# Patient Record
Sex: Male | Born: 1960 | Race: Black or African American | Hispanic: No | Marital: Married | Smoking: Never smoker
Health system: Southern US, Community
[De-identification: ages and names within clinical notes are randomized; demographics above are authoritative.]

## PROBLEM LIST (undated history)

## (undated) DIAGNOSIS — I10 Essential (primary) hypertension: Secondary | ICD-10-CM

## (undated) DIAGNOSIS — I2510 Atherosclerotic heart disease of native coronary artery without angina pectoris: Secondary | ICD-10-CM

## (undated) DIAGNOSIS — R569 Unspecified convulsions: Secondary | ICD-10-CM

## (undated) DIAGNOSIS — N289 Disorder of kidney and ureter, unspecified: Secondary | ICD-10-CM

## (undated) HISTORY — PX: OTHER SURGICAL HISTORY: SHX169

## (undated) HISTORY — PX: ABDOMINAL SURGERY: SHX537

---

## 2013-06-14 ENCOUNTER — Emergency Department (HOSPITAL_COMMUNITY)
Admission: EM | Admit: 2013-06-14 | Discharge: 2013-06-15 | Disposition: A | Discharge status: Home / Self Care | Payer: Self-pay | Attending: Emergency Medicine | Admitting: Emergency Medicine

## 2013-06-14 ENCOUNTER — Encounter (HOSPITAL_COMMUNITY): Payer: Self-pay | Admitting: Adult Health

## 2013-06-14 DIAGNOSIS — T6391XA Toxic effect of contact with unspecified venomous animal, accidental (unintentional), initial encounter: Secondary | ICD-10-CM | POA: Insufficient documentation

## 2013-06-14 DIAGNOSIS — Y9389 Activity, other specified: Secondary | ICD-10-CM | POA: Insufficient documentation

## 2013-06-14 DIAGNOSIS — T63461A Toxic effect of venom of wasps, accidental (unintentional), initial encounter: Secondary | ICD-10-CM | POA: Insufficient documentation

## 2013-06-14 DIAGNOSIS — Y929 Unspecified place or not applicable: Secondary | ICD-10-CM | POA: Insufficient documentation

## 2013-06-14 DIAGNOSIS — L299 Pruritus, unspecified: Secondary | ICD-10-CM | POA: Insufficient documentation

## 2013-06-14 MED ORDER — FAMOTIDINE 20 MG PO TABS
20.0000 mg | ORAL_TABLET | Freq: Once | ORAL | Status: AC
  Administered 2013-06-14: 20 mg via ORAL
  Filled 2013-06-14: qty 1

## 2013-06-14 MED ORDER — PREDNISONE 20 MG PO TABS: 20.0000 mg | ORAL_TABLET | Freq: Two times a day (BID) | ORAL | Status: DC

## 2013-06-14 MED ORDER — DEXAMETHASONE SODIUM PHOSPHATE 10 MG/ML IJ SOLN
10.0000 mg | Freq: Once | INTRAMUSCULAR | Status: AC
  Administered 2013-06-14: 10 mg via INTRAMUSCULAR
  Filled 2013-06-14: qty 1

## 2013-06-14 MED ORDER — EPINEPHRINE 0.3 MG/0.3ML IJ SOAJ: 0.3000 mg | Freq: Once | INTRAMUSCULAR | Status: DC

## 2013-06-14 MED ORDER — DIPHENHYDRAMINE HCL 25 MG PO CAPS
25.0000 mg | ORAL_CAPSULE | Freq: Once | ORAL | Status: AC
  Administered 2013-06-14: 25 mg via ORAL
  Filled 2013-06-14: qty 1

## 2013-06-14 NOTE — ED Notes (Signed)
Presents with urticaria and itching to face, bilateral extremities, legs and back that began after being stung by multiple bees at 20:40 this evening while cleaning around the yard. Denies SOB, airway clear, no stridor, no wheezes, able to speak in full sentences.

## 2013-06-15 NOTE — ED Provider Notes (Signed)
  CSN: [id number]     Arrival date & time 06/14/13  2134 History     First MD Initiated Contact with Patient 06/14/13 2259     Chief Complaint  Patient presents with  . Allergic Reaction   (Consider location/radiation/quality/duration/timing/severity/associated sxs/prior Treatment) HPI History provided by pt.   Pt stung by 7 yellow jackets at 8:45pm today.  Has six stings to LEs, one to posterior neck and one behind L ear.  Developed diffuse, severely pruritic hives shortly after. Associated with edema of lower lip.  Received benadryl, pepcid and prednisone in triage; rash improved and lip edema resolved.    History reviewed. No pertinent past medical history. History reviewed. No pertinent past surgical history. History reviewed. No pertinent family history. History  Substance Use Topics  . Smoking status: Never Smoker   . Smokeless tobacco: Not on file  . Alcohol Use: No    Review of Systems  All other systems reviewed and are negative.    Allergies  Review of patient's allergies indicates no known allergies.  Home Medications   Current Outpatient Rx  Name  Route  Sig  Dispense  Refill  . EPINEPHrine (EPI-PEN) 0.3 mg/0.3 mL SOAJ   Intramuscular   Inject 0.3 mLs (0.3 mg total) into the muscle once.   1 Device   0   . predniSONE (DELTASONE) 20 MG tablet   Oral   Take 1 tablet (20 mg total) by mouth 2 (two) times daily.   10 tablet   0    BP 187/99  Pulse 95  Temp(Src) 98.4 F (36.9 C) (Oral)  Resp 20  Ht 6' (1.829 m)  Wt 187 lb (84.823 kg)  BMI 25.36 kg/m2  SpO2 98% Physical Exam  Nursing note and vitals reviewed. Constitutional: He is oriented to person, place, and time. He appears well-developed and well-nourished. No distress.  Pt is sitting on edge of bed calmly.  He is not scratching.  HENT:  Head: Normocephalic and atraumatic.  Mouth/Throat: Oropharynx is clear and moist and mucous membranes are normal. No posterior oropharyngeal edema.  No lip or  tongue edema.  Eyes:  Normal appearance  Neck: Normal range of motion.  Cardiovascular: Normal rate and regular rhythm.   Pulmonary/Chest: Effort normal and breath sounds normal. No stridor. No respiratory distress.  Musculoskeletal: Normal range of motion.  Neurological: He is alert and oriented to person, place, and time.  Skin: Skin is warm and dry. No rash noted.  Diffuse hives, most concentrated in RUE.    Psychiatric: He has a normal mood and affect. His behavior is normal.    ED Course   Procedures (including critical care time)  Labs Reviewed - No data to display No results found. 1. Allergic reaction to insect sting, initial encounter     MDM  52yo M presents w/ allergic reaction in insect sting.  Treated w/ antihistamines and prednisone in triage and sx improved.  Had edema of lower lip that has resolved.  No signs of impending airway compromise on exam.  Pt feeling well enough to go home.  Recommended bid benadryl/pepcid AC x 3d and prescribed prednisone and epi pen.  Return precautions discussed.   Otilio Miu, PA-C 06/15/13 2155

## 2013-06-16 NOTE — ED Provider Notes (Signed)
Medical screening examination/treatment/procedure(s) were performed by non-physician practitioner and as supervising physician I was immediately available for consultation/collaboration.  John-Adam Loukisha Gunnerson, M.D.  John-Adam Kitai Purdom, MD 06/16/13 0702 

## 2018-08-24 ENCOUNTER — Observation Stay (HOSPITAL_COMMUNITY)
Admission: EM | Admit: 2018-08-24 | Discharge: 2018-08-25 | Disposition: A | Discharge status: Home / Self Care | Payer: No Typology Code available for payment source | Attending: Internal Medicine | Admitting: Internal Medicine

## 2018-08-24 ENCOUNTER — Other Ambulatory Visit: Payer: Self-pay

## 2018-08-24 ENCOUNTER — Emergency Department (HOSPITAL_COMMUNITY): Payer: No Typology Code available for payment source

## 2018-08-24 ENCOUNTER — Encounter (HOSPITAL_COMMUNITY): Payer: Self-pay | Admitting: Emergency Medicine

## 2018-08-24 DIAGNOSIS — Y92414 Local residential or business street as the place of occurrence of the external cause: Secondary | ICD-10-CM | POA: Diagnosis not present

## 2018-08-24 DIAGNOSIS — I10 Essential (primary) hypertension: Secondary | ICD-10-CM | POA: Diagnosis not present

## 2018-08-24 DIAGNOSIS — Y999 Unspecified external cause status: Secondary | ICD-10-CM | POA: Insufficient documentation

## 2018-08-24 DIAGNOSIS — S61511A Laceration without foreign body of right wrist, initial encounter: Secondary | ICD-10-CM | POA: Diagnosis present

## 2018-08-24 DIAGNOSIS — R569 Unspecified convulsions: Secondary | ICD-10-CM | POA: Diagnosis not present

## 2018-08-24 DIAGNOSIS — Z23 Encounter for immunization: Secondary | ICD-10-CM | POA: Insufficient documentation

## 2018-08-24 DIAGNOSIS — Z7982 Long term (current) use of aspirin: Secondary | ICD-10-CM | POA: Insufficient documentation

## 2018-08-24 DIAGNOSIS — N179 Acute kidney failure, unspecified: Secondary | ICD-10-CM | POA: Diagnosis not present

## 2018-08-24 DIAGNOSIS — Y9389 Activity, other specified: Secondary | ICD-10-CM | POA: Insufficient documentation

## 2018-08-24 DIAGNOSIS — Z79899 Other long term (current) drug therapy: Secondary | ICD-10-CM | POA: Diagnosis not present

## 2018-08-24 HISTORY — DX: Essential (primary) hypertension: I10

## 2018-08-24 LAB — SAMPLE TO BLOOD BANK

## 2018-08-24 LAB — CBC
HCT: 46.2 % (ref 39.0–52.0)
Hemoglobin: 14.7 g/dL (ref 13.0–17.0)
MCH: 30.6 pg (ref 26.0–34.0)
MCHC: 31.8 g/dL (ref 30.0–36.0)
MCV: 96.3 fL (ref 78.0–100.0)
PLATELETS: 183 10*3/uL (ref 150–400)
RBC: 4.8 MIL/uL (ref 4.22–5.81)
RDW: 13.1 % (ref 11.5–15.5)
WBC: 9.1 10*3/uL (ref 4.0–10.5)

## 2018-08-24 LAB — COMPREHENSIVE METABOLIC PANEL
ALK PHOS: 67 U/L (ref 38–126)
ALT: 22 U/L (ref 0–44)
AST: 25 U/L (ref 15–41)
Albumin: 4.4 g/dL (ref 3.5–5.0)
Anion gap: 11 (ref 5–15)
BILIRUBIN TOTAL: 1 mg/dL (ref 0.3–1.2)
BUN: 22 mg/dL — ABNORMAL HIGH (ref 6–20)
CALCIUM: 9 mg/dL (ref 8.9–10.3)
CO2: 24 mmol/L (ref 22–32)
CREATININE: 1.94 mg/dL — AB (ref 0.61–1.24)
Chloride: 102 mmol/L (ref 98–111)
GFR calc Af Amer: 42 mL/min — ABNORMAL LOW (ref >60)
GFR, EST NON AFRICAN AMERICAN: 36 mL/min — AB (ref >60)
Glucose, Bld: 118 mg/dL — ABNORMAL HIGH (ref 70–99)
Potassium: 3.8 mmol/L (ref 3.5–5.1)
Sodium: 137 mmol/L (ref 135–145)
Total Protein: 7.3 g/dL (ref 6.5–8.1)

## 2018-08-24 LAB — URINALYSIS, ROUTINE W REFLEX MICROSCOPIC
BILIRUBIN URINE: NEGATIVE
Glucose, UA: NEGATIVE mg/dL
HGB URINE DIPSTICK: NEGATIVE
KETONES UR: NEGATIVE mg/dL
Leukocytes, UA: NEGATIVE
Nitrite: NEGATIVE
PROTEIN: NEGATIVE mg/dL
SPECIFIC GRAVITY, URINE: 1.005 (ref 1.005–1.030)
pH: 7 (ref 5.0–8.0)

## 2018-08-24 LAB — I-STAT TROPONIN, ED: Troponin i, poc: 0 ng/mL (ref 0.00–0.08)

## 2018-08-24 LAB — I-STAT CHEM 8, ED
BUN: 25 mg/dL — AB (ref 6–20)
CHLORIDE: 103 mmol/L (ref 98–111)
CREATININE: 1.9 mg/dL — AB (ref 0.61–1.24)
Calcium, Ion: 1.09 mmol/L — ABNORMAL LOW (ref 1.15–1.40)
Glucose, Bld: 116 mg/dL — ABNORMAL HIGH (ref 70–99)
HCT: 46 % (ref 39.0–52.0)
Hemoglobin: 15.6 g/dL (ref 13.0–17.0)
Potassium: 3.8 mmol/L (ref 3.5–5.1)
Sodium: 138 mmol/L (ref 135–145)
TCO2: 25 mmol/L (ref 22–32)

## 2018-08-24 LAB — PROTIME-INR
INR: 0.96
PROTHROMBIN TIME: 12.7 s (ref 11.4–15.2)

## 2018-08-24 LAB — I-STAT CG4 LACTIC ACID, ED: LACTIC ACID, VENOUS: 1.65 mmol/L (ref 0.5–1.9)

## 2018-08-24 LAB — CBG MONITORING, ED: Glucose-Capillary: 181 mg/dL — ABNORMAL HIGH (ref 70–99)

## 2018-08-24 LAB — ETHANOL

## 2018-08-24 MED ORDER — LORAZEPAM 2 MG/ML IJ SOLN
INTRAMUSCULAR | Status: AC
  Filled 2018-08-24: qty 1

## 2018-08-24 MED ORDER — LORAZEPAM 2 MG/ML IJ SOLN: 1.0000 mg | INTRAMUSCULAR | Status: DC | PRN

## 2018-08-24 MED ORDER — TETANUS-DIPHTH-ACELL PERTUSSIS 5-2.5-18.5 LF-MCG/0.5 IM SUSP
0.5000 mL | Freq: Once | INTRAMUSCULAR | Status: AC
  Administered 2018-08-24: 0.5 mL via INTRAMUSCULAR

## 2018-08-24 MED ORDER — LEVETIRACETAM IN NACL 1000 MG/100ML IV SOLN
1000.0000 mg | Freq: Once | INTRAVENOUS | Status: AC
  Administered 2018-08-24: 1000 mg via INTRAVENOUS
  Filled 2018-08-24: qty 100
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
## 2018-08-24 NOTE — ED Notes (Signed)
XR at bedside
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
## 2018-08-24 NOTE — ED Notes (Signed)
Called to bedside by family due to patient having generalized tonic clonic seizure.  Patient foaming at the mouth, suctioned for small amount of pink tinged saliva.  Patient was incontinent of urine.  Patient cleaned, linens changed and condom cath applied.  Seizure lasted 1-2 mins.

## 2018-08-24 NOTE — ED Notes (Signed)
Patient transported to CT 

## 2018-08-24 NOTE — ED Notes (Signed)
Patient at bedside to give urine sample, patient is CAOx4 at this time.
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
## 2018-08-24 NOTE — ED Notes (Signed)
Pt had generalized jerking that lasted approx 2 minutes  With loss of colnsciouosness.  Sister and wife AT THE BEDSDIE

## 2018-08-25 ENCOUNTER — Observation Stay (HOSPITAL_COMMUNITY): Payer: No Typology Code available for payment source

## 2018-08-25 ENCOUNTER — Encounter (HOSPITAL_COMMUNITY): Payer: Self-pay | Admitting: Internal Medicine

## 2018-08-25 DIAGNOSIS — R569 Unspecified convulsions: Secondary | ICD-10-CM

## 2018-08-25 DIAGNOSIS — N179 Acute kidney failure, unspecified: Secondary | ICD-10-CM | POA: Diagnosis present

## 2018-08-25 LAB — RAPID URINE DRUG SCREEN, HOSP PERFORMED
Amphetamines: NOT DETECTED
Barbiturates: NOT DETECTED
Benzodiazepines: NOT DETECTED
COCAINE: NOT DETECTED
Opiates: NOT DETECTED
TETRAHYDROCANNABINOL: POSITIVE — AB

## 2018-08-25 LAB — CREATININE, URINE, RANDOM: Creatinine, Urine: 24.94 mg/dL

## 2018-08-25 LAB — BASIC METABOLIC PANEL
ANION GAP: 12 (ref 5–15)
BUN: 18 mg/dL (ref 6–20)
CHLORIDE: 106 mmol/L (ref 98–111)
CO2: 21 mmol/L — ABNORMAL LOW (ref 22–32)
Calcium: 9.2 mg/dL (ref 8.9–10.3)
Creatinine, Ser: 1.72 mg/dL — ABNORMAL HIGH (ref 0.61–1.24)
GFR calc Af Amer: 48 mL/min — ABNORMAL LOW (ref >60)
GFR, EST NON AFRICAN AMERICAN: 42 mL/min — AB (ref >60)
GLUCOSE: 122 mg/dL — AB (ref 70–99)
POTASSIUM: 4.2 mmol/L (ref 3.5–5.1)
Sodium: 139 mmol/L (ref 135–145)

## 2018-08-25 LAB — CBC
HCT: 45.9 % (ref 39.0–52.0)
HEMOGLOBIN: 15.1 g/dL (ref 13.0–17.0)
MCH: 30.7 pg (ref 26.0–34.0)
MCHC: 32.9 g/dL (ref 30.0–36.0)
MCV: 93.3 fL (ref 78.0–100.0)
Platelets: 179 10*3/uL (ref 150–400)
RBC: 4.92 MIL/uL (ref 4.22–5.81)
RDW: 13 % (ref 11.5–15.5)
WBC: 13.2 10*3/uL — ABNORMAL HIGH (ref 4.0–10.5)

## 2018-08-25 LAB — SODIUM, URINE, RANDOM: SODIUM UR: 87 mmol/L

## 2018-08-25 LAB — CDS SEROLOGY

## 2018-08-25 MED ORDER — LEVETIRACETAM 500 MG PO TABS: 500.0000 mg | ORAL_TABLET | Freq: Two times a day (BID) | ORAL | 1 refills | Status: DC

## 2018-08-25 MED ORDER — ACETAMINOPHEN 325 MG PO TABS: 650.0000 mg | ORAL_TABLET | Freq: Four times a day (QID) | ORAL | Status: DC | PRN

## 2018-08-25 MED ORDER — HYDRALAZINE HCL 20 MG/ML IJ SOLN: 5.0000 mg | INTRAMUSCULAR | Status: DC | PRN

## 2018-08-25 MED ORDER — SODIUM CHLORIDE 0.9 % IV SOLN
INTRAVENOUS | Status: DC
  Administered 2018-08-25: 11:00:00 via INTRAVENOUS

## 2018-08-25 MED ORDER — LEVETIRACETAM 500 MG PO TABS
500.0000 mg | ORAL_TABLET | Freq: Two times a day (BID) | ORAL | Status: DC
  Filled 2018-08-25: qty 1

## 2018-08-25 MED ORDER — ONDANSETRON HCL 4 MG PO TABS: 4.0000 mg | ORAL_TABLET | Freq: Four times a day (QID) | ORAL | Status: DC | PRN

## 2018-08-25 MED ORDER — GADOBUTROL 1 MMOL/ML IV SOLN
8.0000 mL | Freq: Once | INTRAVENOUS | Status: AC | PRN
  Administered 2018-08-25: 8 mL via INTRAVENOUS

## 2018-08-25 MED ORDER — LEVETIRACETAM IN NACL 500 MG/100ML IV SOLN: 500.0000 mg | Freq: Two times a day (BID) | INTRAVENOUS | Status: DC

## 2018-08-25 MED ORDER — ONDANSETRON HCL 4 MG/2ML IJ SOLN: 4.0000 mg | Freq: Four times a day (QID) | INTRAMUSCULAR | Status: DC | PRN

## 2018-08-25 MED ORDER — MAGIC MOUTHWASH
10.0000 mL | Freq: Three times a day (TID) | ORAL | Status: DC | PRN
  Administered 2018-08-25: 10 mL via ORAL
  Filled 2018-08-25 (×3): qty 10

## 2018-08-25 MED ORDER — ACETAMINOPHEN 650 MG RE SUPP: 650.0000 mg | Freq: Four times a day (QID) | RECTAL | Status: DC | PRN

## 2018-08-25 MED ORDER — ENOXAPARIN SODIUM 40 MG/0.4ML ~~LOC~~ SOLN
40.0000 mg | SUBCUTANEOUS | Status: DC
  Administered 2018-08-25: 40 mg via SUBCUTANEOUS
  Filled 2018-08-25: qty 0.4
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
## 2018-08-25 NOTE — Progress Notes (Signed)
Patient ready for discharge to home; discharge instructions given and reviewed; Rx given;letter given. Patient discharged via wheelchair; accompanied home by his family.

## 2018-08-25 NOTE — Progress Notes (Signed)
Patient ready to mobilize out of bed; with gait belt ambulated down the hallway; patient tolerated activity without distress; reports neck stiffness; gait slightly unsteady upon return to the room.

## 2018-08-25 NOTE — ED Notes (Signed)
Report given to rn on 3  w 

## 2018-08-25 NOTE — Discharge Instructions (Signed)
Seizure, Adult °When you have a seizure: °· Parts of your body may move. °· How aware or awake (conscious) you are may change. °· You may shake (convulse). ° °Some people have symptoms right before a seizure happens. These symptoms may include: °· Fear. °· Worry (anxiety). °· Feeling like you are going to throw up (nausea). °· Feeling like the room is spinning (vertigo). °· Feeling like you saw or heard something before (deja vu). °· Odd tastes or smells. °· Changes in vision, such as seeing flashing lights or spots. ° °Seizures usually last from 30 seconds to 2 minutes. Usually, they are not harmful unless they last a long time. °Follow these instructions at home: °Medicines °· Take over-the-counter and prescription medicines only as told by your doctor. °· Avoid anything that may keep your medicine from working, such as alcohol. °Activity °· Do not do any activities that would be dangerous if you had another seizure, like driving or swimming. Wait until your doctor approves. °· If you live in the [place] ask your local DMV (department of motor vehicles) when you can drive. °· Rest. °Teaching others °· Teach friends and family what to do when you have a seizure. They should: °? Lay you on the ground. °? Protect your head and body. °? Loosen any tight clothing around your neck. °? Turn you on your side. °? Stay with you until you are better. °? Not hold you down. °? Not put anything in your mouth. °? Know whether or not you need emergency care. °General instructions °· Contact your doctor each time you have a seizure. °· Avoid anything that gives you seizures. °· Keep a seizure diary. Write down: °? What you think caused each seizure. °? What you remember about each seizure. °· Keep all follow-up visits as told by your doctor. This is important. °Contact a doctor if: °· You have another seizure. °· You have seizures more often. °· There is any change in what happens during your seizures. °· You continue to have  seizures with treatment. °· You have symptoms of being sick or having an infection. °Get help right away if: °· You have a seizure: °? That lasts longer than 5 minutes. °? That is different than seizures you had before. °? That makes it harder to breathe. °? After you hurt your head. °· After a seizure, you cannot speak or use a part of your body. °· After a seizure, you are confused or have a bad headache. °· You have two or more seizures in a row. °· You are having seizures more often. °· You do not wake up right after a seizure. °· You get hurt during a seizure. °In an emergency: °· These symptoms may be an emergency. Do not wait to see if the symptoms will go away. Get medical help right away. Call your local emergency services (911 in the [place] Do not drive yourself to the hospital. °This information is not intended to replace advice given to you by your health care provider. Make sure you discuss any questions you have with your health care provider. °Document Released: 04/25/2008 Document Revised: 07/20/2016 Document Reviewed: 07/20/2016 °Elsevier Interactive Patient Education © 2017 [employer] ° °
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
## 2018-08-25 NOTE — Progress Notes (Signed)
No further sz, tolerating keppra. MRI wnl.   He does have some short term memory problems, I wonder about early dementing disorder as etiology for seizures vs sz contributing to memory problems.   Continue keppra 500 mg BID Follow up with neurology as outpatient, EEG could be done as outpatient.    Ritta Slot, MD [facility] [phone]  If 7pm- 7am, please page neurology on call as listed in AMION.

## 2018-08-25 NOTE — ED Notes (Signed)
Pt alert talking to frfiends and family at  The bedside

## 2018-08-26 LAB — HIV ANTIBODY (ROUTINE TESTING W REFLEX): HIV Screen 4th Generation wRfx: NONREACTIVE

## 2018-08-27 ENCOUNTER — Encounter (HOSPITAL_COMMUNITY): Payer: Self-pay | Admitting: Adult Health

## 2018-09-05 ENCOUNTER — Ambulatory Visit: Payer: No Typology Code available for payment source | Admitting: Neurology

## 2018-09-19 ENCOUNTER — Other Ambulatory Visit: Payer: No Typology Code available for payment source

## 2018-12-04 ENCOUNTER — Encounter (HOSPITAL_COMMUNITY): Payer: Self-pay

## 2018-12-04 ENCOUNTER — Emergency Department (HOSPITAL_COMMUNITY): Payer: Non-veteran care

## 2018-12-04 ENCOUNTER — Emergency Department (HOSPITAL_COMMUNITY)
Admission: EM | Admit: 2018-12-04 | Discharge: 2018-12-04 | Disposition: A | Discharge status: Home / Self Care | Payer: Non-veteran care | Attending: Emergency Medicine | Admitting: Emergency Medicine

## 2018-12-04 ENCOUNTER — Other Ambulatory Visit: Payer: Self-pay

## 2018-12-04 DIAGNOSIS — Z79899 Other long term (current) drug therapy: Secondary | ICD-10-CM | POA: Insufficient documentation

## 2018-12-04 DIAGNOSIS — G40909 Epilepsy, unspecified, not intractable, without status epilepticus: Secondary | ICD-10-CM | POA: Diagnosis present

## 2018-12-04 DIAGNOSIS — Z7982 Long term (current) use of aspirin: Secondary | ICD-10-CM | POA: Diagnosis not present

## 2018-12-04 DIAGNOSIS — I10 Essential (primary) hypertension: Secondary | ICD-10-CM | POA: Diagnosis not present

## 2018-12-04 HISTORY — DX: Unspecified convulsions: R56.9

## 2018-12-04 LAB — COMPREHENSIVE METABOLIC PANEL
ALBUMIN: 4.2 g/dL (ref 3.5–5.0)
ALK PHOS: 74 U/L (ref 38–126)
ALT: 41 U/L (ref 0–44)
ANION GAP: 21 — AB (ref 5–15)
AST: 39 U/L (ref 15–41)
BUN: 15 mg/dL (ref 6–20)
CALCIUM: 9.3 mg/dL (ref 8.9–10.3)
CO2: 10 mmol/L — AB (ref 22–32)
Chloride: 109 mmol/L (ref 98–111)
Creatinine, Ser: 2.09 mg/dL — ABNORMAL HIGH (ref 0.61–1.24)
GFR calc non Af Amer: 34 mL/min — ABNORMAL LOW (ref >60)
GFR, EST AFRICAN AMERICAN: 39 mL/min — AB (ref >60)
GLUCOSE: 145 mg/dL — AB (ref 70–99)
POTASSIUM: 4 mmol/L (ref 3.5–5.1)
SODIUM: 140 mmol/L (ref 135–145)
Total Bilirubin: 0.8 mg/dL (ref 0.3–1.2)
Total Protein: 7.4 g/dL (ref 6.5–8.1)

## 2018-12-04 LAB — I-STAT CHEM 8, ED
BUN: 16 mg/dL (ref 6–20)
CHLORIDE: 110 mmol/L (ref 98–111)
CREATININE: 1.6 mg/dL — AB (ref 0.61–1.24)
Calcium, Ion: 1.12 mmol/L — ABNORMAL LOW (ref 1.15–1.40)
Glucose, Bld: 97 mg/dL (ref 70–99)
HCT: 46 % (ref 39.0–52.0)
HEMOGLOBIN: 15.6 g/dL (ref 13.0–17.0)
POTASSIUM: 3.9 mmol/L (ref 3.5–5.1)
Sodium: 141 mmol/L (ref 135–145)
TCO2: 21 mmol/L — ABNORMAL LOW (ref 22–32)

## 2018-12-04 LAB — CBC WITH DIFFERENTIAL/PLATELET
ABS IMMATURE GRANULOCYTES: 0.06 10*3/uL (ref 0.00–0.07)
Basophils Absolute: 0 10*3/uL (ref 0.0–0.1)
Basophils Relative: 0 %
Eosinophils Absolute: 0 10*3/uL (ref 0.0–0.5)
Eosinophils Relative: 0 %
HCT: 53.7 % — ABNORMAL HIGH (ref 39.0–52.0)
Hemoglobin: 16.6 g/dL (ref 13.0–17.0)
IMMATURE GRANULOCYTES: 1 %
LYMPHS ABS: 2.7 10*3/uL (ref 0.7–4.0)
LYMPHS PCT: 31 %
MCH: 31.1 pg (ref 26.0–34.0)
MCHC: 30.9 g/dL (ref 30.0–36.0)
MCV: 100.6 fL — ABNORMAL HIGH (ref 80.0–100.0)
MONOS PCT: 8 %
Monocytes Absolute: 0.7 10*3/uL (ref 0.1–1.0)
NEUTROS ABS: 5.4 10*3/uL (ref 1.7–7.7)
Neutrophils Relative %: 60 %
PLATELETS: 187 10*3/uL (ref 150–400)
RBC: 5.34 MIL/uL (ref 4.22–5.81)
RDW: 13.2 % (ref 11.5–15.5)
WBC: 8.9 10*3/uL (ref 4.0–10.5)
nRBC: 0 % (ref 0.0–0.2)

## 2018-12-04 MED ORDER — SODIUM CHLORIDE 0.9 % IV BOLUS
500.0000 mL | Freq: Once | INTRAVENOUS | Status: AC
  Administered 2018-12-04: 500 mL via INTRAVENOUS

## 2018-12-04 MED ORDER — LEVETIRACETAM IN NACL 1000 MG/100ML IV SOLN
1000.0000 mg | Freq: Once | INTRAVENOUS | Status: AC
  Administered 2018-12-04: 1000 mg via INTRAVENOUS
  Filled 2018-12-04: qty 100

## 2018-12-04 MED ORDER — IBUPROFEN 600 MG PO TABS: 600.0000 mg | ORAL_TABLET | Freq: Four times a day (QID) | ORAL | 0 refills | Status: DC | PRN

## 2018-12-04 MED ORDER — ACETAMINOPHEN 325 MG PO TABS
650.0000 mg | ORAL_TABLET | ORAL | Status: DC | PRN
  Administered 2018-12-04: 650 mg via ORAL
  Filled 2018-12-04: qty 2

## 2018-12-04 NOTE — Discharge Instructions (Signed)
Call and make an appointment to follow-up with the neurologist at the [facility].  Increase your Keppra to 500 mg twice daily.  You may take ibuprofen and Tylenol as needed for pain.
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
## 2019-01-07 ENCOUNTER — Other Ambulatory Visit: Payer: Self-pay

## 2019-01-07 ENCOUNTER — Encounter (HOSPITAL_COMMUNITY): Payer: Self-pay

## 2019-01-07 ENCOUNTER — Emergency Department (HOSPITAL_COMMUNITY)
Admission: EM | Admit: 2019-01-07 | Discharge: 2019-01-07 | Disposition: A | Discharge status: Home / Self Care | Payer: Non-veteran care | Attending: Emergency Medicine | Admitting: Emergency Medicine

## 2019-01-07 DIAGNOSIS — Z79899 Other long term (current) drug therapy: Secondary | ICD-10-CM | POA: Diagnosis not present

## 2019-01-07 DIAGNOSIS — R569 Unspecified convulsions: Secondary | ICD-10-CM | POA: Diagnosis not present

## 2019-01-07 DIAGNOSIS — I10 Essential (primary) hypertension: Secondary | ICD-10-CM | POA: Diagnosis not present

## 2019-01-07 LAB — BASIC METABOLIC PANEL
ANION GAP: 6 (ref 5–15)
Anion gap: 18 — ABNORMAL HIGH (ref 5–15)
BUN: 18 mg/dL (ref 6–20)
BUN: 14 mg/dL (ref 6–20)
CALCIUM: 9 mg/dL (ref 8.9–10.3)
CHLORIDE: 106 mmol/L (ref 98–111)
CO2: 15 mmol/L — AB (ref 22–32)
CO2: 22 mmol/L (ref 22–32)
CREATININE: 2 mg/dL — AB (ref 0.61–1.24)
Calcium: 8.3 mg/dL — ABNORMAL LOW (ref 8.9–10.3)
Chloride: 110 mmol/L (ref 98–111)
Creatinine, Ser: 1.63 mg/dL — ABNORMAL HIGH (ref 0.61–1.24)
GFR calc Af Amer: 41 mL/min — ABNORMAL LOW (ref >60)
GFR calc non Af Amer: 35 mL/min — ABNORMAL LOW (ref >60)
GFR calc non Af Amer: 45 mL/min — ABNORMAL LOW (ref >60)
GFR, EST AFRICAN AMERICAN: 53 mL/min — AB (ref >60)
GLUCOSE: 118 mg/dL — AB (ref 70–99)
GLUCOSE: 95 mg/dL (ref 70–99)
Potassium: 4.5 mmol/L (ref 3.5–5.1)
Potassium: 3.9 mmol/L (ref 3.5–5.1)
Sodium: 139 mmol/L (ref 135–145)
Sodium: 138 mmol/L (ref 135–145)

## 2019-01-07 LAB — CBC WITH DIFFERENTIAL/PLATELET
Abs Immature Granulocytes: 0.02 10*3/uL (ref 0.00–0.07)
Basophils Absolute: 0 10*3/uL (ref 0.0–0.1)
Basophils Relative: 0 %
EOS ABS: 0 10*3/uL (ref 0.0–0.5)
EOS PCT: 1 %
HEMATOCRIT: 48.9 % (ref 39.0–52.0)
HEMOGLOBIN: 15.4 g/dL (ref 13.0–17.0)
Immature Granulocytes: 0 %
LYMPHS ABS: 1.9 10*3/uL (ref 0.7–4.0)
LYMPHS PCT: 24 %
MCH: 30.9 pg (ref 26.0–34.0)
MCHC: 31.5 g/dL (ref 30.0–36.0)
MCV: 98.2 fL (ref 80.0–100.0)
Monocytes Absolute: 0.8 10*3/uL (ref 0.1–1.0)
Monocytes Relative: 10 %
Neutro Abs: 5.1 10*3/uL (ref 1.7–7.7)
Neutrophils Relative %: 65 %
Platelets: 152 10*3/uL (ref 150–400)
RBC: 4.98 MIL/uL (ref 4.22–5.81)
RDW: 13.3 % (ref 11.5–15.5)
WBC: 7.8 10*3/uL (ref 4.0–10.5)
nRBC: 0 % (ref 0.0–0.2)

## 2019-01-07 LAB — URINALYSIS, ROUTINE W REFLEX MICROSCOPIC
BACTERIA UA: NONE SEEN
Bilirubin Urine: NEGATIVE
Glucose, UA: NEGATIVE mg/dL
Ketones, ur: NEGATIVE mg/dL
Leukocytes,Ua: NEGATIVE
Nitrite: NEGATIVE
Protein, ur: NEGATIVE mg/dL
Specific Gravity, Urine: 1.011 (ref 1.005–1.030)
pH: 5 (ref 5.0–8.0)

## 2019-01-07 LAB — CBG MONITORING, ED: Glucose-Capillary: 111 mg/dL — ABNORMAL HIGH (ref 70–99)

## 2019-01-07 MED ORDER — LEVETIRACETAM 500 MG PO TABS
500.0000 mg | ORAL_TABLET | Freq: Once | ORAL | Status: DC
  Filled 2019-01-07: qty 1

## 2019-01-07 MED ORDER — LEVETIRACETAM 750 MG PO TABS: 750.0000 mg | ORAL_TABLET | Freq: Two times a day (BID) | ORAL | 0 refills | Status: DC

## 2019-01-07 MED ORDER — LORAZEPAM 2 MG/ML IJ SOLN
1.0000 mg | Freq: Once | INTRAMUSCULAR | Status: DC
  Filled 2019-01-07: qty 1

## 2019-01-07 MED ORDER — SODIUM CHLORIDE 0.9 % IV BOLUS
1000.0000 mL | Freq: Once | INTRAVENOUS | Status: AC
  Administered 2019-01-07: 1000 mL via INTRAVENOUS

## 2019-01-07 MED ORDER — LEVETIRACETAM IN NACL 1000 MG/100ML IV SOLN
1000.0000 mg | Freq: Once | INTRAVENOUS | Status: AC
  Administered 2019-01-07: 1000 mg via INTRAVENOUS
  Filled 2019-01-07: qty 100

## 2019-01-07 MED ORDER — LORAZEPAM 2 MG/ML IJ SOLN
2.0000 mg | Freq: Once | INTRAMUSCULAR | Status: AC
  Administered 2019-01-07: 2 mg via INTRAVENOUS

## 2019-01-07 MED ORDER — IPRATROPIUM-ALBUTEROL 0.5-2.5 (3) MG/3ML IN SOLN: 3.0000 mL | Freq: Once | RESPIRATORY_TRACT | Status: DC

## 2019-01-07 NOTE — ED Notes (Signed)
Pt sleeping. No redness swelling or drainage noted at iv site. NS continues to gravity

## 2019-01-07 NOTE — ED Notes (Signed)
Pt c/o stinging with keppra iv. Harrold Donath of pharmacy contacted and state to decrease rate to 200cc/h. Same done

## 2019-01-07 NOTE — ED Notes (Signed)
Seizure ended.  Pt postictal, given warm blankets.

## 2019-01-07 NOTE — Discharge Instructions (Addendum)
Please increase your Keppra to 750 mg twice a day as recommended by the neurologist today. Please call your neurologist tomorrow for a follow-up appointment regarding your visit today. Please increase your hydration. Return to the ED for worsening symptoms, recurrent seizures, head injuries, loss of consciousness, numbness in arms or legs.

## 2019-01-07 NOTE — ED Notes (Signed)
2mg  Ativan to be given per Erma Heritage MD at bedside .

## 2019-01-07 NOTE — ED Notes (Signed)
Pt ambulated in hallway with steady gait. No complains from the patient as he was ambulating other than being tired.

## 2019-01-07 NOTE — ED Notes (Signed)
Isaacs MD at bedside ordered 2mg  of Ativan versus 1mg .   Waste 0mg  with Facilities manager

## 2019-01-07 NOTE — ED Triage Notes (Addendum)
Pt arrives EMS from home where they were called for witnessed seizure. Friend state pt was asleep in bed and had seizure lasting 9 minutes. No fall . Pt was incontinent. Pt has hx of seizures and is compliant to medication of keppra. Unresponsive on arrival to EMS but rousable and follows some commands now. Last seizure Jan14.2020
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
## 2019-04-25 ENCOUNTER — Emergency Department (HOSPITAL_COMMUNITY): Payer: No Typology Code available for payment source

## 2019-04-25 ENCOUNTER — Encounter (HOSPITAL_COMMUNITY): Payer: Self-pay | Admitting: Nephrology

## 2019-04-25 ENCOUNTER — Observation Stay (HOSPITAL_COMMUNITY)
Admission: EM | Admit: 2019-04-25 | Discharge: 2019-04-26 | Disposition: A | Discharge status: Home / Self Care | Payer: No Typology Code available for payment source | Attending: Internal Medicine | Admitting: Internal Medicine

## 2019-04-25 DIAGNOSIS — G9340 Encephalopathy, unspecified: Secondary | ICD-10-CM

## 2019-04-25 DIAGNOSIS — I10 Essential (primary) hypertension: Secondary | ICD-10-CM | POA: Insufficient documentation

## 2019-04-25 DIAGNOSIS — R569 Unspecified convulsions: Principal | ICD-10-CM | POA: Insufficient documentation

## 2019-04-25 DIAGNOSIS — Z79899 Other long term (current) drug therapy: Secondary | ICD-10-CM | POA: Insufficient documentation

## 2019-04-25 DIAGNOSIS — Z20828 Contact with and (suspected) exposure to other viral communicable diseases: Secondary | ICD-10-CM | POA: Diagnosis not present

## 2019-04-25 DIAGNOSIS — G40919 Epilepsy, unspecified, intractable, without status epilepticus: Secondary | ICD-10-CM | POA: Diagnosis present

## 2019-04-25 LAB — RAPID URINE DRUG SCREEN, HOSP PERFORMED
Amphetamines: NOT DETECTED
Barbiturates: NOT DETECTED
Benzodiazepines: NOT DETECTED
Cocaine: NOT DETECTED
Opiates: NOT DETECTED
Tetrahydrocannabinol: POSITIVE — AB

## 2019-04-25 LAB — URINALYSIS, ROUTINE W REFLEX MICROSCOPIC
Bilirubin Urine: NEGATIVE
Glucose, UA: NEGATIVE mg/dL
Hgb urine dipstick: NEGATIVE
Ketones, ur: 5 mg/dL — AB
Leukocytes,Ua: NEGATIVE
Nitrite: NEGATIVE
Protein, ur: NEGATIVE mg/dL
Specific Gravity, Urine: 1.012 (ref 1.005–1.030)
pH: 6 (ref 5.0–8.0)

## 2019-04-25 LAB — COMPREHENSIVE METABOLIC PANEL
ALT: 35 U/L (ref 0–44)
AST: 29 U/L (ref 15–41)
Albumin: 5.1 g/dL — ABNORMAL HIGH (ref 3.5–5.0)
Alkaline Phosphatase: 103 U/L (ref 38–126)
Anion gap: 9 (ref 5–15)
BUN: 26 mg/dL — ABNORMAL HIGH (ref 6–20)
CO2: 24 mmol/L (ref 22–32)
Calcium: 9.3 mg/dL (ref 8.9–10.3)
Chloride: 107 mmol/L (ref 98–111)
Creatinine, Ser: 1.72 mg/dL — ABNORMAL HIGH (ref 0.61–1.24)
GFR calc Af Amer: 49 mL/min — ABNORMAL LOW (ref >60)
GFR calc non Af Amer: 42 mL/min — ABNORMAL LOW (ref >60)
Glucose, Bld: 99 mg/dL (ref 70–99)
Potassium: 4.3 mmol/L (ref 3.5–5.1)
Sodium: 140 mmol/L (ref 135–145)
Total Bilirubin: 0.9 mg/dL (ref 0.3–1.2)
Total Protein: 8.9 g/dL — ABNORMAL HIGH (ref 6.5–8.1)

## 2019-04-25 LAB — SARS CORONAVIRUS 2 BY RT PCR (HOSPITAL ORDER, PERFORMED IN ~~LOC~~ HOSPITAL LAB): SARS Coronavirus 2: NEGATIVE

## 2019-04-25 LAB — ETHANOL: Alcohol, Ethyl (B): <10 mg/dL (ref <10)

## 2019-04-25 LAB — CBC WITH DIFFERENTIAL/PLATELET
Abs Immature Granulocytes: 0.03 10*3/uL (ref 0.00–0.07)
Basophils Absolute: 0 10*3/uL (ref 0.0–0.1)
Basophils Relative: 0 %
Eosinophils Absolute: 0 10*3/uL (ref 0.0–0.5)
Eosinophils Relative: 0 %
HCT: 53.1 % — ABNORMAL HIGH (ref 39.0–52.0)
Hemoglobin: 16.7 g/dL (ref 13.0–17.0)
Immature Granulocytes: 0 %
Lymphocytes Relative: 14 %
Lymphs Abs: 1.2 10*3/uL (ref 0.7–4.0)
MCH: 30.9 pg (ref 26.0–34.0)
MCHC: 31.5 g/dL (ref 30.0–36.0)
MCV: 98.2 fL (ref 80.0–100.0)
Monocytes Absolute: 0.4 10*3/uL (ref 0.1–1.0)
Monocytes Relative: 5 %
Neutro Abs: 7.2 10*3/uL (ref 1.7–7.7)
Neutrophils Relative %: 81 %
Platelets: 201 10*3/uL (ref 150–400)
RBC: 5.41 MIL/uL (ref 4.22–5.81)
RDW: 13.4 % (ref 11.5–15.5)
WBC: 8.9 10*3/uL (ref 4.0–10.5)
nRBC: 0 % (ref 0.0–0.2)

## 2019-04-25 LAB — MAGNESIUM: Magnesium: 2.5 mg/dL — ABNORMAL HIGH (ref 1.7–2.4)

## 2019-04-25 LAB — CBG MONITORING, ED: Glucose-Capillary: 94 mg/dL (ref 70–99)

## 2019-04-25 MED ORDER — AMMONIA AROMATIC IN INHA
0.3000 mL | Freq: Once | RESPIRATORY_TRACT | Status: AC
  Administered 2019-04-25: 17:00:00 0.3 mL via RESPIRATORY_TRACT
  Filled 2019-04-25: qty 10

## 2019-04-25 MED ORDER — LORAZEPAM 2 MG/ML IJ SOLN
1.0000 mg | Freq: Once | INTRAMUSCULAR | Status: AC
  Administered 2019-04-25: 16:00:00 1 mg via INTRAVENOUS
  Filled 2019-04-25: qty 1

## 2019-04-25 MED ORDER — LEVETIRACETAM IN NACL 1500 MG/100ML IV SOLN
1500.0000 mg | Freq: Once | INTRAVENOUS | Status: AC
  Administered 2019-04-25: 20:00:00 1500 mg via INTRAVENOUS
  Filled 2019-04-25: qty 100

## 2019-04-25 MED ORDER — LORAZEPAM 2 MG/ML IJ SOLN
1.0000 mg | Freq: Once | INTRAMUSCULAR | Status: AC
  Administered 2019-04-25: 17:00:00 1 mg via INTRAVENOUS

## 2019-04-25 MED ORDER — LORAZEPAM 2 MG/ML IJ SOLN: 2.0000 mg | Freq: Once | INTRAMUSCULAR | Status: DC
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
## 2019-04-25 NOTE — Progress Notes (Signed)
[facility] ED TOC CM -referral PCP/3 ED visits in past 6 months  NCM spoke to pt and gave permission to speak to his girlfriend, Ms Theo Dills. States he goes to [place] or [place]. He saw his PCP in March 2020 and the PCP changed his dose on his seizure medication. States he was taking his dose that was prescribed by ED in Feb 2020 after his ED visit and he recently started the new dose from [place] PCP. Girlfriend states she will report to ED physician. She had questions on if a change of dose would cause his current medical problem. Will send message to [facility] and they will continue to follow up after dc. Pt able to afford meds. Isidoro Donning RN CCM Case Mgmt phone [phone]

## 2019-04-25 NOTE — ED Notes (Signed)
PT presented with seizure like activity. While assessing pt would blink to threat and showed control over some extremities when allowing the pts arm to fall toward his face. Pt however would not change composition or activity when painful stimuli was introduced.

## 2019-04-25 NOTE — ED Triage Notes (Signed)
Per EMS- EMS reported that the call was for seizures, but no seizure activity while en route. Patient refuses to talk and follows some commands.  EMs states that the patient had a recent death in his family.   Patient held arms out in order to start an Iv, but when asked to do something else the patient would not do.

## 2019-04-25 NOTE — ED Notes (Signed)
Pt now intermittently following simple commands.

## 2019-04-25 NOTE — ED Notes (Signed)
unable to complete triage due to patient not talking
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
## 2019-04-26 ENCOUNTER — Encounter (HOSPITAL_COMMUNITY): Payer: Self-pay | Admitting: Emergency Medicine

## 2019-04-26 ENCOUNTER — Observation Stay (HOSPITAL_COMMUNITY): Payer: No Typology Code available for payment source

## 2019-04-26 ENCOUNTER — Inpatient Hospital Stay (HOSPITAL_COMMUNITY)
Admission: EM | Admit: 2019-04-26 | Discharge: 2019-05-03 | DRG: 101 | Disposition: A | Discharge status: Home Health | Payer: No Typology Code available for payment source | Attending: Internal Medicine | Admitting: Internal Medicine

## 2019-04-26 ENCOUNTER — Other Ambulatory Visit: Payer: Self-pay

## 2019-04-26 ENCOUNTER — Emergency Department (HOSPITAL_COMMUNITY): Payer: Non-veteran care

## 2019-04-26 DIAGNOSIS — Z79899 Other long term (current) drug therapy: Secondary | ICD-10-CM | POA: Diagnosis not present

## 2019-04-26 DIAGNOSIS — G9340 Encephalopathy, unspecified: Secondary | ICD-10-CM | POA: Diagnosis present

## 2019-04-26 DIAGNOSIS — R569 Unspecified convulsions: Secondary | ICD-10-CM

## 2019-04-26 DIAGNOSIS — R443 Hallucinations, unspecified: Secondary | ICD-10-CM

## 2019-04-26 DIAGNOSIS — Z20828 Contact with and (suspected) exposure to other viral communicable diseases: Secondary | ICD-10-CM | POA: Diagnosis not present

## 2019-04-26 DIAGNOSIS — Z8249 Family history of ischemic heart disease and other diseases of the circulatory system: Secondary | ICD-10-CM

## 2019-04-26 DIAGNOSIS — N183 Chronic kidney disease, stage 3 unspecified: Secondary | ICD-10-CM | POA: Diagnosis present

## 2019-04-26 DIAGNOSIS — G40919 Epilepsy, unspecified, intractable, without status epilepticus: Secondary | ICD-10-CM | POA: Diagnosis present

## 2019-04-26 DIAGNOSIS — Z1159 Encounter for screening for other viral diseases: Secondary | ICD-10-CM

## 2019-04-26 DIAGNOSIS — R296 Repeated falls: Secondary | ICD-10-CM

## 2019-04-26 DIAGNOSIS — E875 Hyperkalemia: Secondary | ICD-10-CM | POA: Diagnosis not present

## 2019-04-26 DIAGNOSIS — R413 Other amnesia: Secondary | ICD-10-CM | POA: Diagnosis present

## 2019-04-26 DIAGNOSIS — Z8042 Family history of malignant neoplasm of prostate: Secondary | ICD-10-CM

## 2019-04-26 DIAGNOSIS — G40209 Localization-related (focal) (partial) symptomatic epilepsy and epileptic syndromes with complex partial seizures, not intractable, without status epilepticus: Principal | ICD-10-CM | POA: Diagnosis present

## 2019-04-26 DIAGNOSIS — R410 Disorientation, unspecified: Secondary | ICD-10-CM

## 2019-04-26 DIAGNOSIS — I10 Essential (primary) hypertension: Secondary | ICD-10-CM | POA: Diagnosis not present

## 2019-04-26 DIAGNOSIS — I160 Hypertensive urgency: Secondary | ICD-10-CM | POA: Diagnosis present

## 2019-04-26 DIAGNOSIS — I129 Hypertensive chronic kidney disease with stage 1 through stage 4 chronic kidney disease, or unspecified chronic kidney disease: Secondary | ICD-10-CM | POA: Diagnosis present

## 2019-04-26 DIAGNOSIS — G4700 Insomnia, unspecified: Secondary | ICD-10-CM | POA: Diagnosis present

## 2019-04-26 LAB — BASIC METABOLIC PANEL
Anion gap: 10 (ref 5–15)
BUN: 18 mg/dL (ref 6–20)
CO2: 24 mmol/L (ref 22–32)
Calcium: 9.2 mg/dL (ref 8.9–10.3)
Chloride: 106 mmol/L (ref 98–111)
Creatinine, Ser: 1.58 mg/dL — ABNORMAL HIGH (ref 0.61–1.24)
GFR calc Af Amer: 54 mL/min — ABNORMAL LOW (ref >60)
GFR calc non Af Amer: 47 mL/min — ABNORMAL LOW (ref >60)
Glucose, Bld: 104 mg/dL — ABNORMAL HIGH (ref 70–99)
Potassium: 4 mmol/L (ref 3.5–5.1)
Sodium: 140 mmol/L (ref 135–145)

## 2019-04-26 LAB — CBC WITH DIFFERENTIAL/PLATELET
Abs Immature Granulocytes: 0.03 10*3/uL (ref 0.00–0.07)
Basophils Absolute: 0 10*3/uL (ref 0.0–0.1)
Basophils Relative: 0 %
Eosinophils Absolute: 0 10*3/uL (ref 0.0–0.5)
Eosinophils Relative: 0 %
HCT: 49.6 % (ref 39.0–52.0)
Hemoglobin: 16.1 g/dL (ref 13.0–17.0)
Immature Granulocytes: 0 %
Lymphocytes Relative: 14 %
Lymphs Abs: 1.4 10*3/uL (ref 0.7–4.0)
MCH: 31.1 pg (ref 26.0–34.0)
MCHC: 32.5 g/dL (ref 30.0–36.0)
MCV: 95.9 fL (ref 80.0–100.0)
Monocytes Absolute: 1 10*3/uL (ref 0.1–1.0)
Monocytes Relative: 10 %
Neutro Abs: 7.3 10*3/uL (ref 1.7–7.7)
Neutrophils Relative %: 76 %
Platelets: 180 10*3/uL (ref 150–400)
RBC: 5.17 MIL/uL (ref 4.22–5.81)
RDW: 13.3 % (ref 11.5–15.5)
WBC: 9.7 10*3/uL (ref 4.0–10.5)
nRBC: 0 % (ref 0.0–0.2)

## 2019-04-26 LAB — COMPREHENSIVE METABOLIC PANEL
ALT: 27 U/L (ref 0–44)
AST: 20 U/L (ref 15–41)
Albumin: 4.3 g/dL (ref 3.5–5.0)
Alkaline Phosphatase: 88 U/L (ref 38–126)
Anion gap: 12 (ref 5–15)
BUN: 20 mg/dL (ref 6–20)
CO2: 23 mmol/L (ref 22–32)
Calcium: 9.4 mg/dL (ref 8.9–10.3)
Chloride: 104 mmol/L (ref 98–111)
Creatinine, Ser: 1.69 mg/dL — ABNORMAL HIGH (ref 0.61–1.24)
GFR calc Af Amer: 50 mL/min — ABNORMAL LOW (ref >60)
GFR calc non Af Amer: 43 mL/min — ABNORMAL LOW (ref >60)
Glucose, Bld: 102 mg/dL — ABNORMAL HIGH (ref 70–99)
Potassium: 3.6 mmol/L (ref 3.5–5.1)
Sodium: 139 mmol/L (ref 135–145)
Total Bilirubin: 0.8 mg/dL (ref 0.3–1.2)
Total Protein: 7.5 g/dL (ref 6.5–8.1)

## 2019-04-26 LAB — CBC
HCT: 46.5 % (ref 39.0–52.0)
Hemoglobin: 15.2 g/dL (ref 13.0–17.0)
MCH: 31 pg (ref 26.0–34.0)
MCHC: 32.7 g/dL (ref 30.0–36.0)
MCV: 94.7 fL (ref 80.0–100.0)
Platelets: 178 10*3/uL (ref 150–400)
RBC: 4.91 MIL/uL (ref 4.22–5.81)
RDW: 13.2 % (ref 11.5–15.5)
WBC: 10.5 10*3/uL (ref 4.0–10.5)
nRBC: 0 % (ref 0.0–0.2)

## 2019-04-26 LAB — CBG MONITORING, ED: Glucose-Capillary: 83 mg/dL (ref 70–99)

## 2019-04-26 LAB — AMMONIA: Ammonia: 27 umol/L (ref 9–35)

## 2019-04-26 LAB — MAGNESIUM: Magnesium: 2.2 mg/dL (ref 1.7–2.4)

## 2019-04-26 MED ORDER — HYDRALAZINE HCL 20 MG/ML IJ SOLN: 10.0000 mg | Freq: Four times a day (QID) | INTRAMUSCULAR | Status: DC | PRN

## 2019-04-26 MED ORDER — ENOXAPARIN SODIUM 40 MG/0.4ML ~~LOC~~ SOLN
40.0000 mg | Freq: Every day | SUBCUTANEOUS | Status: DC
  Administered 2019-04-26: 40 mg via SUBCUTANEOUS
  Filled 2019-04-26: qty 0.4

## 2019-04-26 MED ORDER — LEVETIRACETAM 750 MG PO TABS: 1500.0000 mg | ORAL_TABLET | Freq: Two times a day (BID) | ORAL | Status: DC

## 2019-04-26 MED ORDER — ACETAMINOPHEN 650 MG RE SUPP: 650.0000 mg | Freq: Four times a day (QID) | RECTAL | Status: DC | PRN

## 2019-04-26 MED ORDER — ONDANSETRON HCL 4 MG PO TABS: 4.0000 mg | ORAL_TABLET | Freq: Four times a day (QID) | ORAL | Status: DC | PRN

## 2019-04-26 MED ORDER — POLYETHYLENE GLYCOL 3350 17 G PO PACK: 17.0000 g | PACK | Freq: Every day | ORAL | Status: DC | PRN

## 2019-04-26 MED ORDER — LEVETIRACETAM 500 MG PO TABS: 1000.0000 mg | ORAL_TABLET | Freq: Two times a day (BID) | ORAL | Status: DC

## 2019-04-26 MED ORDER — ONDANSETRON HCL 4 MG/2ML IJ SOLN
4.0000 mg | Freq: Four times a day (QID) | INTRAMUSCULAR | Status: DC | PRN
  Administered 2019-04-26: 02:00:00 4 mg via INTRAVENOUS
  Filled 2019-04-26: qty 2

## 2019-04-26 MED ORDER — LORAZEPAM 2 MG/ML IJ SOLN: 1.0000 mg | INTRAMUSCULAR | Status: DC | PRN

## 2019-04-26 MED ORDER — VITAMIN D 25 MCG (1000 UNIT) PO TABS
2000.0000 [IU] | ORAL_TABLET | Freq: Every day | ORAL | Status: DC
  Administered 2019-04-26: 2000 [IU] via ORAL
  Filled 2019-04-26: qty 2

## 2019-04-26 MED ORDER — ACETAMINOPHEN 325 MG PO TABS: 650.0000 mg | ORAL_TABLET | Freq: Four times a day (QID) | ORAL | Status: DC | PRN

## 2019-04-26 MED ORDER — LEVETIRACETAM 750 MG PO TABS: 1500.0000 mg | ORAL_TABLET | Freq: Two times a day (BID) | ORAL | 0 refills | Status: DC

## 2019-04-26 MED ORDER — DILTIAZEM HCL ER COATED BEADS 240 MG PO CP24
240.0000 mg | ORAL_CAPSULE | Freq: Every day | ORAL | Status: DC
  Administered 2019-04-26: 240 mg via ORAL
  Filled 2019-04-26: qty 1

## 2019-04-26 MED ORDER — TRAMADOL HCL 50 MG PO TABS: 50.0000 mg | ORAL_TABLET | Freq: Four times a day (QID) | ORAL | Status: DC | PRN

## 2019-04-26 MED ORDER — SODIUM CHLORIDE 0.45 % IV SOLN
INTRAVENOUS | Status: DC
  Administered 2019-04-26: 02:00:00 via INTRAVENOUS

## 2019-04-26 MED ORDER — LEVETIRACETAM 750 MG PO TABS
1250.0000 mg | ORAL_TABLET | Freq: Two times a day (BID) | ORAL | Status: DC
  Administered 2019-04-26 (×2): 1250 mg via ORAL
  Filled 2019-04-26 (×2): qty 1

## 2019-04-26 NOTE — Progress Notes (Signed)
EEG complete - results pending 
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
## 2019-04-26 NOTE — Procedures (Signed)
ELECTROENCEPHALOGRAM REPORT   Patient: Raymond Cox       Room #: 7C62B EEG No. ID: [id number] Age: 58 y.o.        Sex: male Referring Physician: Benjamine Mola Report Date:  04/26/2019        Interpreting Physician: Thana Farr  History: Raymond Cox is an 58 y.o. male with a history of seizures presenting with breakthrough seizures  Medications:  Cardizem, Keppra  Conditions of Recording:  This is a 21 channel routine scalp EEG performed with bipolar and monopolar montages arranged in accordance to the international 10/20 system of electrode placement. One channel was dedicated to EKG recording.  The patient is in the awake and drowsy states.  Description:  The waking background activity consists of a low voltage, symmetrical, fairly well organized, 9 Hz alpha activity, seen from the parieto-occipital and posterior temporal regions.  Low voltage fast activity, poorly organized, is seen anteriorly and is at times superimposed on more posterior regions.  A mixture of theta and alpha rhythms are seen from the central and temporal regions. The patient drowses with slowing to irregular, low voltage theta and beta activity.   There are frequent, periodic, left frontal spike and slow wave discharges seen over the left frontal region with occasional spread to the right frontal region.  At times these appear out of a normal background.  At other times they are associated with general background slowing that is more prominent over the left hemisphere.  There are multiple occasions when these discharges occur at a frequency of about 1/sec and may last from 15-30 seconds.  No change in clinical activity is noted with these discharges.  Hyperventilation was not performed.   Intermittent photic stimulation was performed but failed to illicit any change in the tracing.   IMPRESSION: This is an abnormal electroencephalogram secondary to frequent left PLEDs with a frontal predominance.  At times this PLEDS activity  is somewhat rhythmic.  Can not rule out subclinical seizure activity.     Thana Farr, MD Neurology [phone] 04/26/2019, 2:10 PM

## 2019-04-26 NOTE — ED Notes (Signed)
Family states pt has been acting strangely since his nephew died 03-18-23 night.
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
## 2019-04-26 NOTE — ED Triage Notes (Signed)
Pt BIB family who reports pt was discharged this morning from [facility] for seizures. Family reports pt has fallen "10 times today" and may have hit his head. Also reports that pt has been acting strangely, looking behind him as though he is hallucinating, and "talking out of his head". Pt denies pain, family denies recent seizures.
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
## 2019-04-26 NOTE — Discharge Instructions (Signed)
Per [place] DMV statutes, patients with seizures are not allowed to drive until they have been seizure-free for six months.    Use caution when using heavy equipment or power tools. Avoid working on ladders or at heights. Take showers instead of baths. Ensure the water temperature is not too high on the home water heater. Do not go swimming alone. Do not lock yourself in a room alone (i.e. bathroom). When caring for infants or small children, sit down when holding, feeding, or changing them to minimize risk of injury to the child in the event you have a seizure. Maintain good sleep hygiene. Avoid alcohol.    If patient has another seizure, call 911 and bring them back to the ED if: A.  The seizure lasts longer than 5 minutes.      B.  The patient doesn't wake shortly after the seizure or has new problems such as difficulty seeing, speaking or moving following the seizure C.  The patient was injured during the seizure D.  The patient has a temperature over 102 F (39C) E.  The patient vomited during the seizure and now is having trouble breathing  

## 2019-04-26 NOTE — Progress Notes (Signed)
No further seizures today.  Patient is awake, alert and oriented.  Has mild right lower extremity weakness.  EEG shows frequent left-sided PLEDs.  Recommended increasing Keppra to 1.5 g twice daily.  I reviewed his MRI brain done in October as well as repeat CT head done yesterday showed no mass lesion to explain patient's left-sided PLEDs.

## 2019-04-27 ENCOUNTER — Observation Stay (HOSPITAL_COMMUNITY): Payer: Non-veteran care

## 2019-04-27 ENCOUNTER — Other Ambulatory Visit: Payer: Self-pay

## 2019-04-27 DIAGNOSIS — G40919 Epilepsy, unspecified, intractable, without status epilepticus: Secondary | ICD-10-CM

## 2019-04-27 DIAGNOSIS — R443 Hallucinations, unspecified: Secondary | ICD-10-CM

## 2019-04-27 DIAGNOSIS — R410 Disorientation, unspecified: Secondary | ICD-10-CM

## 2019-04-27 DIAGNOSIS — N183 Chronic kidney disease, stage 3 unspecified: Secondary | ICD-10-CM | POA: Diagnosis present

## 2019-04-27 DIAGNOSIS — I160 Hypertensive urgency: Secondary | ICD-10-CM | POA: Diagnosis not present

## 2019-04-27 DIAGNOSIS — R569 Unspecified convulsions: Secondary | ICD-10-CM

## 2019-04-27 DIAGNOSIS — R4182 Altered mental status, unspecified: Secondary | ICD-10-CM

## 2019-04-27 LAB — URINALYSIS, COMPLETE (UACMP) WITH MICROSCOPIC
Bacteria, UA: NONE SEEN
Bilirubin Urine: NEGATIVE
Glucose, UA: NEGATIVE mg/dL
Hgb urine dipstick: NEGATIVE
Ketones, ur: 5 mg/dL — AB
Leukocytes,Ua: NEGATIVE
Nitrite: NEGATIVE
Protein, ur: NEGATIVE mg/dL
Specific Gravity, Urine: 1.006 (ref 1.005–1.030)
pH: 6 (ref 5.0–8.0)

## 2019-04-27 LAB — TSH: TSH: 1.202 u[IU]/mL (ref 0.350–4.500)

## 2019-04-27 LAB — VITAMIN B12: Vitamin B-12: 617 pg/mL (ref 180–914)

## 2019-04-27 LAB — SARS CORONAVIRUS 2 BY RT PCR (HOSPITAL ORDER, PERFORMED IN ~~LOC~~ HOSPITAL LAB): SARS Coronavirus 2: NEGATIVE

## 2019-04-27 LAB — SEDIMENTATION RATE: Sed Rate: 1 mm/hr (ref 0–16)

## 2019-04-27 LAB — C-REACTIVE PROTEIN: CRP: <0.8 mg/dL (ref <1.0)

## 2019-04-27 MED ORDER — ZIPRASIDONE MESYLATE 20 MG IM SOLR
10.0000 mg | Freq: Once | INTRAMUSCULAR | Status: AC
  Administered 2019-04-27: 10 mg via INTRAMUSCULAR
  Filled 2019-04-27: qty 20

## 2019-04-27 MED ORDER — SODIUM CHLORIDE 0.9% FLUSH
3.0000 mL | Freq: Two times a day (BID) | INTRAVENOUS | Status: DC
  Administered 2019-04-27 – 2019-05-02 (×8): 3 mL via INTRAVENOUS

## 2019-04-27 MED ORDER — ALBUTEROL SULFATE (2.5 MG/3ML) 0.083% IN NEBU: 2.5000 mg | INHALATION_SOLUTION | RESPIRATORY_TRACT | Status: DC | PRN

## 2019-04-27 MED ORDER — LORAZEPAM 2 MG/ML IJ SOLN
2.0000 mg | INTRAMUSCULAR | Status: AC
  Administered 2019-04-27: 2 mg via INTRAVENOUS
  Filled 2019-04-27: qty 1

## 2019-04-27 MED ORDER — ONDANSETRON HCL 4 MG/2ML IJ SOLN
4.0000 mg | Freq: Four times a day (QID) | INTRAMUSCULAR | Status: DC | PRN
  Administered 2019-05-02: 12:00:00 4 mg via INTRAVENOUS
  Filled 2019-04-27: qty 2

## 2019-04-27 MED ORDER — HYDRALAZINE HCL 20 MG/ML IJ SOLN: 10.0000 mg | INTRAMUSCULAR | Status: DC | PRN

## 2019-04-27 MED ORDER — LORAZEPAM 2 MG/ML IJ SOLN
1.0000 mg | Freq: Once | INTRAMUSCULAR | Status: AC
  Administered 2019-04-27: 1 mg via INTRAVENOUS
  Filled 2019-04-27: qty 1

## 2019-04-27 MED ORDER — OXCARBAZEPINE 300 MG PO TABS
300.0000 mg | ORAL_TABLET | Freq: Two times a day (BID) | ORAL | Status: DC
  Administered 2019-04-27 – 2019-04-28 (×2): 300 mg via ORAL
  Filled 2019-04-27 (×3): qty 1

## 2019-04-27 MED ORDER — ACETAMINOPHEN 325 MG PO TABS: 650.0000 mg | ORAL_TABLET | ORAL | Status: DC | PRN

## 2019-04-27 MED ORDER — LORAZEPAM 2 MG/ML IJ SOLN
INTRAMUSCULAR | Status: AC
  Administered 2019-04-27: 2 mg via INTRAVENOUS
  Filled 2019-04-27: qty 1

## 2019-04-27 MED ORDER — ONDANSETRON HCL 4 MG PO TABS: 4.0000 mg | ORAL_TABLET | Freq: Three times a day (TID) | ORAL | Status: DC | PRN

## 2019-04-27 MED ORDER — VITAMIN D 25 MCG (1000 UNIT) PO TABS
2000.0000 [IU] | ORAL_TABLET | Freq: Every day | ORAL | Status: DC
  Administered 2019-04-27 – 2019-05-03 (×7): 2000 [IU] via ORAL
  Filled 2019-04-27 (×7): qty 2

## 2019-04-27 MED ORDER — LEVETIRACETAM 500 MG PO TABS
1000.0000 mg | ORAL_TABLET | Freq: Two times a day (BID) | ORAL | Status: DC
  Administered 2019-04-27 – 2019-05-03 (×12): 1000 mg via ORAL
  Filled 2019-04-27 (×12): qty 2

## 2019-04-27 MED ORDER — ENOXAPARIN SODIUM 40 MG/0.4ML ~~LOC~~ SOLN
40.0000 mg | SUBCUTANEOUS | Status: DC
  Administered 2019-04-27 – 2019-05-02 (×6): 40 mg via SUBCUTANEOUS
  Filled 2019-04-27 (×6): qty 0.4

## 2019-04-27 MED ORDER — ALUM & MAG HYDROXIDE-SIMETH 200-200-20 MG/5ML PO SUSP: 30.0000 mL | Freq: Four times a day (QID) | ORAL | Status: DC | PRN

## 2019-04-27 MED ORDER — VALPROATE SODIUM 500 MG/5ML IV SOLN
500.0000 mg | Freq: Once | INTRAVENOUS | Status: AC
  Administered 2019-04-27: 500 mg via INTRAVENOUS
  Filled 2019-04-27: qty 5

## 2019-04-27 MED ORDER — ZIPRASIDONE MESYLATE 20 MG IM SOLR: 20.0000 mg | Freq: Once | INTRAMUSCULAR | Status: DC

## 2019-04-27 MED ORDER — STERILE WATER FOR INJECTION IJ SOLN
INTRAMUSCULAR | Status: AC
  Administered 2019-04-27: 2.1 mL
  Filled 2019-04-27: qty 10

## 2019-04-27 MED ORDER — ONDANSETRON HCL 4 MG PO TABS: 4.0000 mg | ORAL_TABLET | Freq: Four times a day (QID) | ORAL | Status: DC | PRN

## 2019-04-27 MED ORDER — LORAZEPAM 2 MG/ML IJ SOLN
1.0000 mg | INTRAMUSCULAR | Status: DC | PRN
  Administered 2019-04-27: 1 mg via INTRAVENOUS
  Filled 2019-04-27: qty 1

## 2019-04-27 MED ORDER — DILTIAZEM HCL ER COATED BEADS 240 MG PO CP24
240.0000 mg | ORAL_CAPSULE | Freq: Every day | ORAL | Status: DC
  Administered 2019-04-27 – 2019-05-03 (×7): 240 mg via ORAL
  Filled 2019-04-27 (×4): qty 1
  Filled 2019-04-27: qty 2
  Filled 2019-04-27 (×3): qty 1

## 2019-04-27 MED ORDER — LEVETIRACETAM 500 MG PO TABS
1500.0000 mg | ORAL_TABLET | Freq: Two times a day (BID) | ORAL | Status: DC
  Administered 2019-04-27 (×2): 1500 mg via ORAL
  Filled 2019-04-27: qty 3
  Filled 2019-04-27: qty 2

## 2019-04-27 MED ORDER — DIVALPROEX SODIUM 250 MG PO DR TAB
750.0000 mg | DELAYED_RELEASE_TABLET | Freq: Two times a day (BID) | ORAL | Status: DC
  Administered 2019-04-27 – 2019-04-29 (×4): 750 mg via ORAL
  Filled 2019-04-27 (×4): qty 3

## 2019-04-27 MED ORDER — VALPROATE SODIUM 500 MG/5ML IV SOLN
2500.0000 mg | Freq: Once | INTRAVENOUS | Status: AC
  Administered 2019-04-27: 2500 mg via INTRAVENOUS
  Filled 2019-04-27: qty 25

## 2019-04-27 MED ORDER — OXCARBAZEPINE 300 MG PO TABS
600.0000 mg | ORAL_TABLET | Freq: Once | ORAL | Status: AC
  Administered 2019-04-27: 600 mg via ORAL
  Filled 2019-04-27: qty 2

## 2019-04-27 MED ORDER — SODIUM CHLORIDE 0.9 % IV SOLN
75.0000 mL/h | INTRAVENOUS | Status: DC
  Administered 2019-04-27 – 2019-05-02 (×8): 75 mL/h via INTRAVENOUS

## 2019-04-27 NOTE — ED Notes (Signed)
Lunch tray ordered for pt.

## 2019-04-27 NOTE — Progress Notes (Signed)
Per Patriciaann Clan, PA pt is recommended for continued observation for safety and stabilization and to be reassessed in the AM by psych. EDP Law, Bea Graff, PA-C and pt's nurse have been advised.   Lind Covert, MSW, LCSW Therapeutic Triage Specialist  [phone]

## 2019-04-27 NOTE — ED Notes (Addendum)
Pt stood on bed, then threw himself onto floor as if he was trying to flip. Suezanne Jacquet, RN and [employer] witnessed fall. Pt family was at bedside and also witnessed fall. Per Charge, Pt fiance can stay in room with him due to pt AMS.

## 2019-04-27 NOTE — Progress Notes (Signed)
LTM started; Nurse tech educated on event button and that patient cannot get out of bed. Dr Deborah Chalk notified.

## 2019-04-27 NOTE — ED Notes (Signed)
Neuro MD at bedside

## 2019-04-27 NOTE — ED Notes (Signed)
Assisted sitter with pt to use urinal.

## 2019-04-27 NOTE — ED Notes (Signed)
Patient got out of bed without assistance and was talking loud and aggressive; Pt was redirected back to room; pt was unsteady on feet and was kept from falling by near by sitter in unit; pt does not form words that is understandable by staff; Seizure pads remain on bed due to acute hx; Patient asked not to get out of bed without assistance-Monique,RN
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
## 2019-04-27 NOTE — ED Notes (Signed)
Breakfast tray ordered for pt,  

## 2019-04-27 NOTE — ED Notes (Signed)
Pt got up out of bed and stood beside of bed. Pt assisted w/getting back into bed w/o difficulty.

## 2019-04-27 NOTE — ED Notes (Signed)
TTS consult in progress. °

## 2019-04-27 NOTE — ED Notes (Signed)
Called pt's sister, Tye [place], to advise of pt's pending medical admission. She voiced understanding and agreement w/tx plan. States she will call pt's fiance to advise her.

## 2019-04-27 NOTE — ED Notes (Signed)
Pt resting in bed watching tv sitter remains at bedside.
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
## 2019-04-27 NOTE — ED Provider Notes (Signed)
Patient with seizures and confusion.  Seen yesterday and admitted to hospital.  Released this morning.  Law, PA-C,  consulted with Dr. Rory Percy, neurology, again tonight, who states symptoms likely not neurologic and could be psychogenic.  Patient hasn't slept in more than 48 hours.  Recent death of family member this week.  Labs reviewed.  CT head tonight negative.  Last night neurology advised against MRI due to recent MRI 7-8 months ago.  TTS consult pending.  TTS recommends AM psych eval.  2:32 AM Called to bedside after patient tried to jump out of bed.  Patient responds appropriately to my questions, but then periodically bounces his legs up and down on the bed. Seems voluntary movements.  Question whether the patient has had some kind of psychotic break with all of the recent stressors he has had.  Patient to be seen by psych in the morning.  Will give Geodon 10mg  for patient safety.  No QTC prolongation.     Montine Circle, PA-C [id number]    Delora Fuel, MD 65/78/46 [phone]
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
## 2019-04-27 NOTE — ED Notes (Signed)
Pt's sister - Eldon Zietlow - [phone] hm / [phone] cell - called to check on pt. Pt gave verbal permission to give info. She asked if pt's fiance could call and speak w/pt - advised yes.

## 2019-04-27 NOTE — ED Notes (Signed)
Pt received breakfast tray 
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
## 2019-04-27 NOTE — ED Provider Notes (Signed)
  Physical Exam  BP (!) 167/98 (BP Location: Right Arm)   Pulse 89   Temp 99.8 F (37.7 C) (Oral)   Resp 18   SpO2 99%   Physical Exam  ED Course/Procedures   Clinical Course as of Apr 26 1141  Fri Apr 26, 2019  2348 I discussed patient case with neurologist, Dr. Malen Gauze, who evaluated the patient last night.  He states that there is nothing else to do from a neurological perspective and he feels that patient symptoms are probably psychogenic.  He recommends psychiatric evaluation. TTS consult pending.   [AL]    Clinical Course User Index [AL] Frederica Kuster, PA-C    Procedures  MDM  11:38 AM I was called by Darnelle Maffucci nurse practitioner behavioral health to inform me patient has been cleared psychiatrically.  He was able to talk to his fiance who reported patient was confused, was having multiple falls.  I personally evaluated patient this morning, while arriving there RN was given patient Ativan, he had been seizing episode prior to my arrival.  I have spoken to Dr. Leonel Ramsay of neurology, he reported patient was having a seizure episode while he was in the room, he will be getting a Depakote loading dose along with will need admission for further evaluation of his altered mental status.  At this time will place call for hospitalist admission.  I have discussed this case with my attending Dr. Ronnald Nian who agrees with plan at this time.  11:53 AM I spoke to Dr. Tamala Julian who will admit patient for further evaluation at this time.    Portions of this note were generated with [employer]. Dictation errors may occur despite best attempts at proofreading.         Janeece Fitting, PA-C 04/27/19 [place], [place], DO 04/27/19 1439

## 2019-04-27 NOTE — ED Notes (Signed)
Dr Leonel Ramsay in w/pt - verbal order received for Ativan 2mg  IV now d/t pt experiencing seizure. Given.

## 2019-04-27 NOTE — ED Notes (Signed)
End of TTS consult. Pt remains in bed, sitter at bedside.

## 2019-04-27 NOTE — Progress Notes (Signed)
PLEDs continue on EEG. Clincal seizures also continue to be reported. Will load give additional 500mg  IV depacon, and additional 2mg  IV ativan.   Will follow.   Roland Rack, MD [facility] [phone]  If 7pm- 7am, please page neurology on call as listed in [place].

## 2019-04-28 DIAGNOSIS — G9340 Encephalopathy, unspecified: Secondary | ICD-10-CM

## 2019-04-28 LAB — CBC
HCT: 49.9 % (ref 39.0–52.0)
Hemoglobin: 16 g/dL (ref 13.0–17.0)
MCH: 31.3 pg (ref 26.0–34.0)
MCHC: 32.1 g/dL (ref 30.0–36.0)
MCV: 97.7 fL (ref 80.0–100.0)
Platelets: 165 10*3/uL (ref 150–400)
RBC: 5.11 MIL/uL (ref 4.22–5.81)
RDW: 13.3 % (ref 11.5–15.5)
WBC: 7.8 10*3/uL (ref 4.0–10.5)
nRBC: 0 % (ref 0.0–0.2)

## 2019-04-28 LAB — BASIC METABOLIC PANEL
Anion gap: 11 (ref 5–15)
BUN: 18 mg/dL (ref 6–20)
CO2: 22 mmol/L (ref 22–32)
Calcium: 9.6 mg/dL (ref 8.9–10.3)
Chloride: 111 mmol/L (ref 98–111)
Creatinine, Ser: 1.73 mg/dL — ABNORMAL HIGH (ref 0.61–1.24)
GFR calc Af Amer: 49 mL/min — ABNORMAL LOW (ref >60)
GFR calc non Af Amer: 42 mL/min — ABNORMAL LOW (ref >60)
Glucose, Bld: 94 mg/dL (ref 70–99)
Potassium: 5.3 mmol/L — ABNORMAL HIGH (ref 3.5–5.1)
Sodium: 144 mmol/L (ref 135–145)

## 2019-04-28 LAB — VALPROIC ACID LEVEL: Valproic Acid Lvl: 93 ug/mL (ref 50.0–100.0)

## 2019-04-28 LAB — POTASSIUM: Potassium: 4.2 mmol/L (ref 3.5–5.1)

## 2019-04-28 MED ORDER — OXCARBAZEPINE 300 MG PO TABS
300.0000 mg | ORAL_TABLET | Freq: Once | ORAL | Status: AC
  Administered 2019-04-28: 300 mg via ORAL
  Filled 2019-04-28 (×2): qty 1

## 2019-04-28 MED ORDER — OXCARBAZEPINE 300 MG PO TABS
600.0000 mg | ORAL_TABLET | Freq: Two times a day (BID) | ORAL | Status: DC
  Administered 2019-04-28 – 2019-05-03 (×10): 600 mg via ORAL
  Filled 2019-04-28 (×12): qty 2
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
## 2019-04-28 NOTE — Procedures (Signed)
Electroencephalogram report.  Long-term monitoring  Recording begins 04/27/2019 at 1623 Recording and 04/28/2019 at 07 30  Day 1 CPT 95720  The requisition: [employer] 10-20 for electrode placement.  18 channels EEG with additional eyes limited ipsilateral ears and EKG for additional T1-T2 approach also used  At this intensive EEG monitoring with simultaneous video monitoring was performed for this patient with convulsions to rule out clinical and subclinical seizures.  Several pushbutton activation events appeared to be accidents.  Background activities marked by mild background activity slowing with more prominent slowing across left hemisphere.  Superimposed near continuous left frontotemporal spikes and a left periodic lateralized epileptiform discharges present throughout the recording.  A left hemispheric periodic lateralized epileptiform discharges at times have brought negative field extending to the right hemispheric cortex.  Occasionally runs of left periodic epileptiform discharges has mildly evolving features and may be consistent with some subclinical electrographic seizures.  Clinical interpretation: This day 1 of intensive EEG monitoring with simultaneous video monitoring is abnormal for several reasons.  #1 mild background activity slowing suggestive of mild encephalopathy of nonspecific etiologies #2 more prominent left hemispheric slowing with superimposed left frontotemporal spikes and left periodic epileptiform discharges suggestive of neuronal dysfunction and cortical irritability.  Intermittently evolving features of left periodic lateralized epileptiform discharges may be suggestive of brief electrographic seizures.  Continuous monitoring is recommended to ensure improvement.  Clinical correlation is advised.

## 2019-04-28 NOTE — Progress Notes (Signed)
[facility] for [facility] on [facility]. TOC following for d/c needs.

## 2019-04-28 NOTE — Progress Notes (Signed)
LTM maintenance completed; reglued O2, no skin breakdown was seen.

## 2019-04-28 NOTE — Progress Notes (Signed)
Subjective: Patient feels much better today  Exam: Vitals:   04/28/19 0857 04/28/19 1211  BP: (!) 175/102 (!) 161/109  Pulse: 78 71  Resp: 16 18  Temp: 98.2 F (36.8 C) 97.9 F (36.6 C)  SpO2: 100% 98%   Gen: In bed, NAD Resp: non-labored breathing, no acute distress Abd: soft, nt  Neuro: MS: Awake, alert, able to repeat much better than yesterday. CN: Visual fields are full, he has a mild right exotropia which he states is baseline, face symmetric Motor: He may still have a very mild right motor neglect, but this is improved from yesterday, no weakness to confrontation Sensory: Intact light touch  Pertinent Labs: Creatinine 1.73, sodium 144  Impression: 58 year old male who presented with focal status epilepticus (recurrent seizures without return to baseline).  He is currently on 3 antiepileptics and seems to be improving clinically.  Though he continues to have periodic discharges on his EEG, his clinical improvement is reassuring.   Typically I start Trileptal at 300 twice daily and then gradually increase after a week to 600 twice daily due to auto induction by Trileptal.  The fact that he is already on Depakote, however, which is also inducer and the fact that in this setting I am less concerned about transient high levels of Trileptal made me feel that we could safely go ahead and increase this to 600 twice daily.  Recommendations: 1) continue keppra 1g bid(renally dosed) 2) continue depakote 750mg  BID, level today 3) increase trileptal to 600mg  BID 4) continue EEG  Roland Rack, MD [facility] [phone]  If 7pm- 7am, please page neurology on call as listed in [place].

## 2019-04-29 DIAGNOSIS — G9340 Encephalopathy, unspecified: Secondary | ICD-10-CM | POA: Diagnosis not present

## 2019-04-29 LAB — COMPREHENSIVE METABOLIC PANEL
ALT: 27 U/L (ref 0–44)
AST: 40 U/L (ref 15–41)
Albumin: 3.6 g/dL (ref 3.5–5.0)
Alkaline Phosphatase: 91 U/L (ref 38–126)
Anion gap: 11 (ref 5–15)
BUN: 19 mg/dL (ref 6–20)
CO2: 21 mmol/L — ABNORMAL LOW (ref 22–32)
Calcium: 9.2 mg/dL (ref 8.9–10.3)
Chloride: 106 mmol/L (ref 98–111)
Creatinine, Ser: 1.47 mg/dL — ABNORMAL HIGH (ref 0.61–1.24)
GFR calc Af Amer: 59 mL/min — ABNORMAL LOW (ref >60)
GFR calc non Af Amer: 51 mL/min — ABNORMAL LOW (ref >60)
Glucose, Bld: 92 mg/dL (ref 70–99)
Potassium: 5.6 mmol/L — ABNORMAL HIGH (ref 3.5–5.1)
Sodium: 138 mmol/L (ref 135–145)
Total Bilirubin: 1.1 mg/dL (ref 0.3–1.2)
Total Protein: 6.7 g/dL (ref 6.5–8.1)

## 2019-04-29 LAB — POTASSIUM: Potassium: 4 mmol/L (ref 3.5–5.1)

## 2019-04-29 LAB — CBC
HCT: 51.6 % (ref 39.0–52.0)
Hemoglobin: 17.3 g/dL — ABNORMAL HIGH (ref 13.0–17.0)
MCH: 31.2 pg (ref 26.0–34.0)
MCHC: 33.5 g/dL (ref 30.0–36.0)
MCV: 93.1 fL (ref 80.0–100.0)
Platelets: 132 10*3/uL — ABNORMAL LOW (ref 150–400)
RBC: 5.54 MIL/uL (ref 4.22–5.81)
RDW: 13.2 % (ref 11.5–15.5)
WBC: 7.6 10*3/uL (ref 4.0–10.5)
nRBC: 0 % (ref 0.0–0.2)

## 2019-04-29 LAB — MAGNESIUM: Magnesium: 2.2 mg/dL (ref 1.7–2.4)

## 2019-04-29 MED ORDER — DIVALPROEX SODIUM 250 MG PO DR TAB
250.0000 mg | DELAYED_RELEASE_TABLET | Freq: Once | ORAL | Status: AC
  Administered 2019-04-29: 250 mg via ORAL
  Filled 2019-04-29: qty 1

## 2019-04-29 MED ORDER — LORAZEPAM 2 MG/ML IJ SOLN
1.0000 mg | Freq: Two times a day (BID) | INTRAMUSCULAR | Status: DC
  Administered 2019-04-29 – 2019-04-30 (×3): 1 mg via INTRAVENOUS
  Filled 2019-04-29 (×3): qty 1

## 2019-04-29 MED ORDER — HYDRALAZINE HCL 10 MG PO TABS
10.0000 mg | ORAL_TABLET | Freq: Three times a day (TID) | ORAL | Status: DC
  Administered 2019-04-29 – 2019-05-01 (×5): 10 mg via ORAL
  Filled 2019-04-29 (×5): qty 1

## 2019-04-29 MED ORDER — DIVALPROEX SODIUM 250 MG PO DR TAB
1000.0000 mg | DELAYED_RELEASE_TABLET | Freq: Two times a day (BID) | ORAL | Status: DC
  Administered 2019-04-29 – 2019-05-03 (×8): 1000 mg via ORAL
  Filled 2019-04-29 (×8): qty 4

## 2019-04-29 NOTE — Progress Notes (Signed)
PHARMACY - PHYSICIAN COMMUNICATION CRITICAL VALUE ALERT - BLOOD CULTURE IDENTIFICATION (BCID)  MCGWIRE DASARO is an 58 y.o. male who presented to [facility] on 04/26/2019 with a chief complaint of multiple seizures  Assessment:  Gram Positive Rods in 1 of 2 sets of blood cultures. BCID not performed as is probably a contaminant.   Name of physician (or Provider) Contacted: Fayrene Helper, MD  Current antibiotics: None  Changes to prescribed antibiotics recommended:  No change recommended   Jacqueline Spofford A. Levada Dy, PharmD, [place] Please utilize Amion for appropriate phone number to reach the unit pharmacist ([place])   04/29/2019  9:25 AM
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
## 2019-04-29 NOTE — Procedures (Signed)
Electroencephalogram report.  Long-term monitoring  Recording begins 04/28/2019 at 0730 Recording and 04/29/2019 at 07 30  Day 2 CPT 95720  The requisition: International 10-20 for electrode placement.  18 channels EEG with additional eyes limited ipsilateral ears and EKG for additional T1-T2 approach also used  At this intensive EEG monitoring with simultaneous video monitoring was performed for this patient with convulsions to rule out clinical and subclinical seizures.  Day 1 Several pushbutton activation events appeared to be accidents.  Background activities marked by mild background activity slowing with more prominent slowing across left hemisphere.  Superimposed near continuous left frontotemporal spikes and a left periodic lateralized epileptiform discharges present throughout the recording.  A left hemispheric periodic lateralized epileptiform discharges at times have brought negative field extending to the right hemispheric cortex.  Occasionally runs of left periodic epileptiform discharges has mildly evolving features and may be consistent with some subclinical electrographic seizures.  Day 2: Background activities marked by mild background activity slowing.  More prominent slowing present across left hemisphere.  Superimposed continues waxing and waning rhythmic periodic left hemispheric sharp waves present with frequencies 1/s and maximum negativity in the left central parietal region with broad negative field extending to the adjacent left hemispheric cortex and right paracentral region.  There was no obvious clinical accompaniment.  Clinical interpretation: This day 2 of intensive EEG monitoring with simultaneous video monitoring is abnormal due to ongoing subclinical electrographic seizures arising from left hemisphere maximum negativity left central parietal cortex as discussed above.  No obvious clinical accompaniment.  Continuous monitoring is recommended to ensure resolution of  seizures.  Clinical correlation is advised.

## 2019-04-29 NOTE — Progress Notes (Signed)
Subjective: No seizures reported by nursing overnight. Subclinical seizures still seen on EEG.   Exam: Vitals:   04/29/19 0540 04/29/19 0950  BP: (!) 176/92 (!) 188/94  Pulse: (!) 59 (!) 58  Resp: 18 16  Temp: 98.6 F (37 C) 97.6 F (36.4 C)  SpO2: 98% 100%   Gen: In bed, NAD Resp: non-labored breathing, no acute distress Abd: soft, nt  Neuro: MS: Awake, alert, able to repeat.  Oriented to year and place, when asked the month he initially states may, then corrects himself to June. CN: Visual fields are full, he has a mild right exotropia which he states is baseline, face symmetric Motor: He has good strength throughout, motor coordination of the right side has significantly improved. Sensory: Intact light touch  Pertinent Labs: Creatinine 1.73, sodium 144  Impression: 58 year old male who presented with focal status epilepticus (recurrent seizures without return to baseline).  He is currently on 3 antiepileptics and seems to be improving clinically, but still with seizures on EEG. I will add scheduled benzo at this time.   Recommendations: 1) continue keppra 1g bid(renally dosed) 2) increase depakote to 1g BID, level in am.  3) increase trileptal to 600mg  BID 4) Scheduled ativan 1mg  q12 x 2 doses.  5) continue EEG  Roland Rack, MD [facility] [phone]  If 7pm- 7am, please page neurology on call as listed in [place].

## 2019-04-29 NOTE — Progress Notes (Signed)
PHARMACY - PHYSICIAN COMMUNICATION CRITICAL VALUE ALERT - BLOOD CULTURE IDENTIFICATION (BCID)  Raymond Cox is an 58 y.o. male who presented to [facility] on 04/26/2019 with a chief complaint of seizure.   Assessment:  Patient blood cultures from 6/6 growing gram positive rods in 1/4 bottles. This likely represents contamination. Patient stable and afebrile off antibiotics.   Name of physician (or Provider) Contacted: Florene Glen   Current antibiotics: None  Changes to prescribed antibiotics recommended:  No antibiotics needed   No results found for this or any previous visit.  [name], PharmD, BCPS, BCIDP Infectious Diseases Clinical Pharmacist Phone: [phone] 04/29/2019  2:41 PM

## 2019-04-29 NOTE — Progress Notes (Signed)
vLTM EEG maint complete. Continue to monitor 

## 2019-04-30 DIAGNOSIS — G9340 Encephalopathy, unspecified: Secondary | ICD-10-CM | POA: Diagnosis not present

## 2019-04-30 LAB — COMPREHENSIVE METABOLIC PANEL
ALT: 21 U/L (ref 0–44)
AST: 16 U/L (ref 15–41)
Albumin: 3.1 g/dL — ABNORMAL LOW (ref 3.5–5.0)
Alkaline Phosphatase: 79 U/L (ref 38–126)
Anion gap: 8 (ref 5–15)
BUN: 17 mg/dL (ref 6–20)
CO2: 22 mmol/L (ref 22–32)
Calcium: 8.6 mg/dL — ABNORMAL LOW (ref 8.9–10.3)
Chloride: 107 mmol/L (ref 98–111)
Creatinine, Ser: 1.48 mg/dL — ABNORMAL HIGH (ref 0.61–1.24)
GFR calc Af Amer: 59 mL/min — ABNORMAL LOW (ref >60)
GFR calc non Af Amer: 51 mL/min — ABNORMAL LOW (ref >60)
Glucose, Bld: 92 mg/dL (ref 70–99)
Potassium: 4 mmol/L (ref 3.5–5.1)
Sodium: 137 mmol/L (ref 135–145)
Total Bilirubin: 0.7 mg/dL (ref 0.3–1.2)
Total Protein: 6.1 g/dL — ABNORMAL LOW (ref 6.5–8.1)

## 2019-04-30 LAB — CULTURE, BLOOD (ROUTINE X 2)
Special Requests: ADEQUATE
Special Requests: ADEQUATE

## 2019-04-30 LAB — CBC
HCT: 46.2 % (ref 39.0–52.0)
Hemoglobin: 15.3 g/dL (ref 13.0–17.0)
MCH: 31.2 pg (ref 26.0–34.0)
MCHC: 33.1 g/dL (ref 30.0–36.0)
MCV: 94.1 fL (ref 80.0–100.0)
Platelets: 165 10*3/uL (ref 150–400)
RBC: 4.91 MIL/uL (ref 4.22–5.81)
RDW: 13 % (ref 11.5–15.5)
WBC: 7.5 10*3/uL (ref 4.0–10.5)
nRBC: 0 % (ref 0.0–0.2)

## 2019-04-30 LAB — VALPROIC ACID LEVEL: Valproic Acid Lvl: 82 ug/mL (ref 50.0–100.0)

## 2019-04-30 LAB — MAGNESIUM: Magnesium: 1.9 mg/dL (ref 1.7–2.4)

## 2019-04-30 NOTE — Procedures (Signed)
CPT/Type of Study: [id number]; 12-26hr continuous EEG with video Recording dates: 04/29/19 @07 :30 to 04/30/19 @07 :30 Recording epoch: 3 Requesting provider: Leonel Ramsay Interpreting physician: Becky Sax, MD  Indications for Procedure: Seizures Primary neurological diagnosis: Status epilepticus   History: This is a 58 year old patient, undergoing continuous EEG to evaluate for seizures.   EEG Details: Continuous video EEG was performed using standard setting per the guidelines of American Clinical Neurophysiology Society (ACNS). A minimum of 21 electrodes were placed on scalp according to the International 10-20 or 10-10 system. Supplemental electrodes were placed as needed. Single EKG electrode was also used to detect cardiac arrhythmia. Recording was performed at a sampling rate of at least 256 Hz. Patient's behavior was continuously recorded on video simultaneously with EEG. A minimum of 18 channels were used for data display. Each epoch of study was reviewed manually daily and as needed using standard digital review software allowing for montage reformatting, gain and filter changes on a display system of sufficient resolution to prevent aliasing. Computerized quantitative EEG analysis (such as compressed spectral array analysis, dipole analysis, trending, automated spike & seizure detection) was used as indicated.  Description of EEG features: State of patient: Awake and drowsy  Background Activity Overall Amplitude: medium amplitude (20-50 V), symmetric  Predominant Frequency: A posterior dominant rhythm of 9 Hz is observed on the right, which reaches only 7-8 Hz on the left Superimposed Frequencies: continuous medium amplitude (20-50 V) polymorphic theta>delta rhythms, lateralized to the left hemisphere Reactivity to stimulation: present Asymmetry: No  Breach rhythm: No  Sleep: No normal sleep architecture is seen.  Background abnormalities: Yes 1. Continuous slow, left  hemisphere  Periodic or rhythmic abnormalities: Yes 1. Sharp LPDs, left frontal Maximum: F3>F7 Amount: This pattern is abundant (~50-60% of the recording), long (5-59 minutes) in duration, with a frequency of about 1 / 1-3 seconds and has waveforms that are monophasic, with sharp morphology and medium-high amplitude (50-100 V). It fluctuates slowly and is more prominent in sleep. There are no discrete periods of evolution.   Epileptiform discharges: Yes - as above Paroxysmal events or seizures: No Push button events: No  EKG: 70 bpm and regular  Impression This continuous EEG is indicative of: 1. An area of structural abnormality and/or cortical dysfunction in the left hemisphere associated with epileptogenic potential. There is an intermittent periodic pattern of sharp waves (PLEDs) which lies on the ictal-interictal continuum and is suggestive of acute-subacute injury.   No seizures are seen.  Recommendations: Continue VEEG to ensure the absence of subclinical seizures and guide management.

## 2019-04-30 NOTE — Progress Notes (Signed)
LTM maintenance completed; pt had minor skin breakdown at A1, A2, Fp1, and Fp2. Frontal leads moved 1 cm to the right.
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
## 2019-04-30 NOTE — Progress Notes (Signed)
Subjective: No seizures overnight  clinically or by EEG.  Exam: Vitals:   04/30/19 0739 04/30/19 1134  BP: (!) 154/96 (!) 154/90  Pulse: 69 73  Resp: 20 20  Temp: 97.8 F (36.6 C) 97.6 F (36.4 C)  SpO2: 100% 100%   Gen: In bed, NAD Resp: non-labored breathing, no acute distress Abd: soft, nt  Neuro: MS: Somnolent but easily arousable. able to repeat.  Oriented to year and place, when asked the month he initially states may, then corrects himself to June. CN: Visual fields are full, he has a mild right exotropia which he states is baseline, face symmetric Motor: He has good strength throughout, motor coordination of the right side has significantly improved. Sensory: Intact light touch  Pertinent Labs: Creatinine 1.48  Impression: 58 year old male who presented with focal status epilepticus (recurrent seizures without return to baseline).  He is currently on 3 antiepileptics and seems to be improving clinically.  Periodic discharges at this point abruptly dissipate on awakening.  Recommendations: 1) continue keppra 1g bid(renally dosed) 2) continue Depakote 1 g twice daily 3) continue Trileptal 600 twice daily 4) discontinue Ativan 5) can discontinue  EEG  Roland Rack, MD [facility] [phone]  If 7pm- 7am, please page neurology on call as listed in [place].

## 2019-05-01 DIAGNOSIS — G9340 Encephalopathy, unspecified: Secondary | ICD-10-CM | POA: Diagnosis not present

## 2019-05-01 LAB — COMPREHENSIVE METABOLIC PANEL
ALT: 17 U/L (ref 0–44)
AST: 14 U/L — ABNORMAL LOW (ref 15–41)
Albumin: 3.1 g/dL — ABNORMAL LOW (ref 3.5–5.0)
Alkaline Phosphatase: 73 U/L (ref 38–126)
Anion gap: 8 (ref 5–15)
BUN: 20 mg/dL (ref 6–20)
CO2: 22 mmol/L (ref 22–32)
Calcium: 8.5 mg/dL — ABNORMAL LOW (ref 8.9–10.3)
Chloride: 109 mmol/L (ref 98–111)
Creatinine, Ser: 1.56 mg/dL — ABNORMAL HIGH (ref 0.61–1.24)
GFR calc Af Amer: 55 mL/min — ABNORMAL LOW (ref >60)
GFR calc non Af Amer: 48 mL/min — ABNORMAL LOW (ref >60)
Glucose, Bld: 83 mg/dL (ref 70–99)
Potassium: 4.1 mmol/L (ref 3.5–5.1)
Sodium: 139 mmol/L (ref 135–145)
Total Bilirubin: 0.6 mg/dL (ref 0.3–1.2)
Total Protein: 5.8 g/dL — ABNORMAL LOW (ref 6.5–8.1)

## 2019-05-01 LAB — CBC
HCT: 44.1 % (ref 39.0–52.0)
Hemoglobin: 14.5 g/dL (ref 13.0–17.0)
MCH: 31.2 pg (ref 26.0–34.0)
MCHC: 32.9 g/dL (ref 30.0–36.0)
MCV: 94.8 fL (ref 80.0–100.0)
Platelets: 134 10*3/uL — ABNORMAL LOW (ref 150–400)
RBC: 4.65 MIL/uL (ref 4.22–5.81)
RDW: 13.2 % (ref 11.5–15.5)
WBC: 6.3 10*3/uL (ref 4.0–10.5)
nRBC: 0 % (ref 0.0–0.2)

## 2019-05-01 LAB — MAGNESIUM: Magnesium: 2 mg/dL (ref 1.7–2.4)

## 2019-05-01 MED ORDER — HYDRALAZINE HCL 25 MG PO TABS
25.0000 mg | ORAL_TABLET | Freq: Three times a day (TID) | ORAL | Status: DC
  Administered 2019-05-01 – 2019-05-03 (×6): 25 mg via ORAL
  Filled 2019-05-01 (×6): qty 1
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
## 2019-05-02 DIAGNOSIS — G9340 Encephalopathy, unspecified: Secondary | ICD-10-CM | POA: Diagnosis not present

## 2019-05-02 LAB — GLUCOSE, CAPILLARY: Glucose-Capillary: 95 mg/dL (ref 70–99)

## 2019-05-02 NOTE — Plan of Care (Signed)
Patient stable, discussed recommended POC with patient and spouse. Not agreeable with inpatient rehab disposition. MD aware.
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
## 2019-05-02 NOTE — Progress Notes (Signed)
Rehab Admissions Coordinator Note:  Patient was screened by Michel Santee for appropriateness for an Inpatient Acute Rehab Consult.  Note pt's insurance is [place], which is not accepted by CIR.  Shann Medal, PT, DPT Admissions Coordinator [phone] 05/02/19  3:37 PM
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
## 2019-05-03 DIAGNOSIS — G9340 Encephalopathy, unspecified: Secondary | ICD-10-CM | POA: Diagnosis not present

## 2019-05-03 LAB — RENAL FUNCTION PANEL
Albumin: 3.3 g/dL — ABNORMAL LOW (ref 3.5–5.0)
Anion gap: 7 (ref 5–15)
BUN: 16 mg/dL (ref 6–20)
CO2: 26 mmol/L (ref 22–32)
Calcium: 9 mg/dL (ref 8.9–10.3)
Chloride: 109 mmol/L (ref 98–111)
Creatinine, Ser: 1.67 mg/dL — ABNORMAL HIGH (ref 0.61–1.24)
GFR calc Af Amer: 51 mL/min — ABNORMAL LOW (ref >60)
GFR calc non Af Amer: 44 mL/min — ABNORMAL LOW (ref >60)
Glucose, Bld: 89 mg/dL (ref 70–99)
Phosphorus: 3.4 mg/dL (ref 2.5–4.6)
Potassium: 4.7 mmol/L (ref 3.5–5.1)
Sodium: 142 mmol/L (ref 135–145)

## 2019-05-03 LAB — CBC WITH DIFFERENTIAL/PLATELET
Abs Immature Granulocytes: 0.02 10*3/uL (ref 0.00–0.07)
Basophils Absolute: 0 10*3/uL (ref 0.0–0.1)
Basophils Relative: 0 %
Eosinophils Absolute: 0.1 10*3/uL (ref 0.0–0.5)
Eosinophils Relative: 1 %
HCT: 46.4 % (ref 39.0–52.0)
Hemoglobin: 15.1 g/dL (ref 13.0–17.0)
Immature Granulocytes: 0 %
Lymphocytes Relative: 14 %
Lymphs Abs: 0.8 10*3/uL (ref 0.7–4.0)
MCH: 31.1 pg (ref 26.0–34.0)
MCHC: 32.5 g/dL (ref 30.0–36.0)
MCV: 95.7 fL (ref 80.0–100.0)
Monocytes Absolute: 0.6 10*3/uL (ref 0.1–1.0)
Monocytes Relative: 10 %
Neutro Abs: 4.2 10*3/uL (ref 1.7–7.7)
Neutrophils Relative %: 75 %
Platelets: 148 10*3/uL — ABNORMAL LOW (ref 150–400)
RBC: 4.85 MIL/uL (ref 4.22–5.81)
RDW: 13 % (ref 11.5–15.5)
WBC: 5.7 10*3/uL (ref 4.0–10.5)
nRBC: 0 % (ref 0.0–0.2)

## 2019-05-03 LAB — MAGNESIUM: Magnesium: 2.1 mg/dL (ref 1.7–2.4)

## 2019-05-03 MED ORDER — LEVETIRACETAM 1000 MG PO TABS: 1000.0000 mg | ORAL_TABLET | Freq: Two times a day (BID) | ORAL | 2 refills | Status: AC

## 2019-05-03 MED ORDER — HYDRALAZINE HCL 25 MG PO TABS: 25.0000 mg | ORAL_TABLET | Freq: Three times a day (TID) | ORAL | 2 refills | Status: DC

## 2019-05-03 MED ORDER — DIVALPROEX SODIUM 500 MG PO DR TAB: 1000.0000 mg | DELAYED_RELEASE_TABLET | Freq: Two times a day (BID) | ORAL | 2 refills | Status: AC

## 2019-05-03 MED ORDER — OXCARBAZEPINE 600 MG PO TABS: 600.0000 mg | ORAL_TABLET | Freq: Two times a day (BID) | ORAL | 2 refills | Status: AC
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
## 2019-05-03 NOTE — Discharge Summary (Signed)
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 [facility], [address] [place], [place] [address]    Report Status 04/30/2019 FINAL  Final  Culture, blood (routine x 2)     Status: Abnormal   Collection Time: 04/27/19  2:09 PM   Specimen: BLOOD  Result Value Ref Range Status   Specimen Description BLOOD LEFT ANTECUBITAL  Final   Special Requests AEROBIC BOTTLE ONLY Blood Culture adequate volume   Final   Culture  Setup Time   Final    GRAM POSITIVE RODS AEROBIC BOTTLE ONLY CRITICAL RESULT CALLED TO, READ BACK BY AND VERIFIED WITH: D. PIERCE, PHARMD AT 0920 ON 04/29/19 BY C. JESSUP, MLT.    Culture (A)  Final    DIPHTHEROIDS(CORYNEBACTERIUM SPECIES) Standardized susceptibility testing for this organism is not available. Performed at [facility], [address] [place], [place] [address]    Report Status 04/30/2019 FINAL  Final  Culture, blood (routine x 2)     Status: None (Preliminary result)   Collection Time: 04/29/19  7:19 PM   Specimen: BLOOD  Result Value Ref Range Status   Specimen Description BLOOD LEFT ANTECUBITAL  Final   Special Requests   Final    BOTTLES DRAWN AEROBIC AND ANAEROBIC Blood Culture adequate volume   Culture   Final    NO GROWTH 3 DAYS Performed at [facility], [address] [place], [place] [address]    Report Status PENDING  Incomplete  Culture, blood (routine x 2)     Status: None (Preliminary result)   Collection Time: 04/29/19  7:25 PM   Specimen: BLOOD LEFT HAND  Result Value Ref Range Status   Specimen Description BLOOD LEFT HAND  Final   Special Requests   Final    BOTTLES DRAWN AEROBIC ONLY Blood Culture adequate volume   Culture   Final    NO GROWTH 3 DAYS Performed at [facility], [address] [place], [place] [address]    Report Status PENDING  Incomplete     Time coordinating discharge: 35 minutes  SIGNED:   Teddy Spikeyrone A Brier Firebaugh, DO  [facility] 05/03/2019, 8:45 AM Pager   If 7PM-7AM, please contact night-coverage [email or web address] Password [email or web address]

## 2019-05-03 NOTE — TOC Transition Note (Signed)
Transition of Care [facility]) - CM/SW Discharge Note   Patient Details  Name: Raymond Cox MRN: [id number] Date of Birth: 1961-07-11  Transition of Care [facility]) CM/SW Contact:  Pollie Friar, RN Phone Number: 05/03/2019, 1:08 PM   Clinical Narrative:    Pt has decided to d/c home with [facility]. Pt is a member of [employer] [place]. Orders sent to them for [facility] and DME.  Pts girlfriend to pick up the DME and medications at the [facility]. Girlfriend to provide transport home.    Final next level of care: [place] Barriers to Discharge: No Barriers Identified   Patient Goals and CMS Choice Patient states their goals for this hospitalization and ongoing recovery are:: to get home CMS Medicare.gov Compare Post Acute Care list provided to:: Patient Represenative (must comment) Choice offered to / list presented to : Patient, Spouse  Discharge Placement                       Discharge Plan and Services   Discharge Planning Services: CM Consult Post Acute Care Choice: Home Health, Durable Medical Equipment          DME Arranged: 3-N-1, Walker rolling, Wheelchair manual DME Agency: Other - Comment([facility]) Date DME Agency Contacted: 05/03/19   Representative spoke with at DME Agency: Have spoken to pts SW: Marzetta Board at [place]: PT, OT [place] Agency: [place] Date [place]: 05/03/19 Time [place]: 72 Representative spoke with at [place]: cheryl  Social Determinants of Health ([place]) Interventions     Readmission Risk Interventions No flowsheet data found.

## 2019-05-03 NOTE — Progress Notes (Signed)
    Durable Medical Equipment  (From admission, onward)         Start     Ordered   05/03/19 0848  For home use only DME 3 n 1  Once     05/03/19 0848   05/03/19 0847  For home use only DME Walker rolling  Once    Question Answer Comment  Patient needs a walker to treat with the following condition Generalized weakness   Patient needs a walker to treat with the following condition Debility      05/03/19 0848
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
## 2019-05-04 LAB — CULTURE, BLOOD (ROUTINE X 2)
Culture: NO GROWTH
Culture: NO GROWTH
Special Requests: ADEQUATE
Special Requests: ADEQUATE

## 2019-07-31 ENCOUNTER — Ambulatory Visit
Admission: EM | Admit: 2019-07-31 | Discharge: 2019-07-31 | Disposition: A | Discharge status: Home / Self Care | Payer: Non-veteran care | Attending: Physician Assistant | Admitting: Physician Assistant

## 2019-07-31 DIAGNOSIS — Z20828 Contact with and (suspected) exposure to other viral communicable diseases: Secondary | ICD-10-CM | POA: Diagnosis not present

## 2019-07-31 DIAGNOSIS — J0140 Acute pansinusitis, unspecified: Secondary | ICD-10-CM | POA: Diagnosis not present

## 2019-07-31 DIAGNOSIS — Z20822 Contact with and (suspected) exposure to covid-19: Secondary | ICD-10-CM

## 2019-07-31 DIAGNOSIS — B9689 Other specified bacterial agents as the cause of diseases classified elsewhere: Secondary | ICD-10-CM

## 2019-07-31 HISTORY — DX: Atherosclerotic heart disease of native coronary artery without angina pectoris: I25.10

## 2019-07-31 HISTORY — DX: Disorder of kidney and ureter, unspecified: N28.9

## 2019-07-31 MED ORDER — PREDNISONE 50 MG PO TABS: 50.0000 mg | ORAL_TABLET | Freq: Every day | ORAL | 0 refills | Status: DC

## 2019-07-31 MED ORDER — DOXYCYCLINE HYCLATE 100 MG PO CAPS: 100.0000 mg | ORAL_CAPSULE | Freq: Two times a day (BID) | ORAL | 0 refills | Status: DC
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
## 2019-07-31 NOTE — ED Triage Notes (Signed)
Pt c/o scratchy throat, watery eyes, congestion, dry cough, and runny nose for 2wks, needs a covid test before he comes back to work.

## 2019-07-31 NOTE — Discharge Instructions (Signed)
As discussed, cannot rule out COVID. Currently, no alarming signs. Testing ordered. I would like you to quarantine until testing results. Start doxycycline for sinus infection, this will also cover you for lung infection. Prednisone for sinus pressure, cough. If experiencing shortness of breath, trouble breathing, call 911 and provide them with your current situation.

## 2019-08-01 ENCOUNTER — Telehealth: Payer: Self-pay | Admitting: Emergency Medicine

## 2019-08-01 LAB — NOVEL CORONAVIRUS, NAA: SARS-CoV-2, NAA: NOT DETECTED

## 2019-08-05 ENCOUNTER — Telehealth: Payer: Self-pay | Admitting: Emergency Medicine

## 2019-08-05 NOTE — Telephone Encounter (Signed)
Patient presented to [facility] to receive test results for COVID.  Negative result provided, patient to fill out medical release form to get print out from registration

## 2019-12-18 ENCOUNTER — Encounter: Payer: Self-pay | Admitting: Emergency Medicine

## 2019-12-18 ENCOUNTER — Other Ambulatory Visit: Payer: Self-pay

## 2019-12-18 ENCOUNTER — Ambulatory Visit
Admission: EM | Admit: 2019-12-18 | Discharge: 2019-12-18 | Disposition: A | Discharge status: Home / Self Care | Payer: Non-veteran care | Attending: Physician Assistant | Admitting: Physician Assistant

## 2019-12-18 DIAGNOSIS — R0981 Nasal congestion: Secondary | ICD-10-CM

## 2019-12-18 DIAGNOSIS — J029 Acute pharyngitis, unspecified: Secondary | ICD-10-CM | POA: Diagnosis not present

## 2019-12-18 DIAGNOSIS — Z20822 Contact with and (suspected) exposure to covid-19: Secondary | ICD-10-CM | POA: Diagnosis not present

## 2019-12-18 MED ORDER — LIDOCAINE VISCOUS HCL 2 % MT SOLN: OROMUCOSAL | 0 refills | Status: DC

## 2019-12-18 MED ORDER — FLUTICASONE PROPIONATE 50 MCG/ACT NA SUSP: 2.0000 | Freq: Every day | NASAL | 0 refills | Status: DC
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
## 2019-12-18 NOTE — Discharge Instructions (Signed)
COVID PCR testing ordered. I would like you to quarantine until testing results. Start lidocaine for sore throat, do not eat or drink for the next 40 mins after use as it can stunt your gag reflex. Flonase for drainage. Tylenol/motrin for pain and fever. Keep hydrated, urine should be clear to pale yellow in color. If experiencing shortness of breath, trouble breathing, go to the emergency department for further evaluation needed.   For sore throat/cough try using a honey-based tea. Use 3 teaspoons of honey with juice squeezed from half lemon. Place shaved pieces of ginger into 1/2-1 cup of water and warm over stove top. Then mix the ingredients and repeat every 4 hours as needed.

## 2019-12-18 NOTE — ED Triage Notes (Signed)
Pt presents to [facility] for assessment of 2-3 weeks of scratchy throat, mild nasal congestion, mild cough.  Denies n/v/d, abdominal pain, headaches, body aches.

## 2019-12-19 LAB — NOVEL CORONAVIRUS, NAA: SARS-CoV-2, NAA: NOT DETECTED

## 2020-01-10 IMAGING — CT CT HEAD WITHOUT CONTRAST
4 series · 15 of 47 positions shown, 17 images · non-contrast
Comparison: CT head dated 04/25/2019

CLINICAL DATA: Recurrent falls.

EXAM:
CT HEAD WITHOUT CONTRAST
TECHNIQUE: Contiguous axial images were obtained from the base of the skull
through the vertex without intravenous contrast.

[Series 3: head wo · axial · 0.47mm/px · z∈[+1343,+1468]mm · 7 of 35 slices shown, 9 images]
[im 5/35  brain]
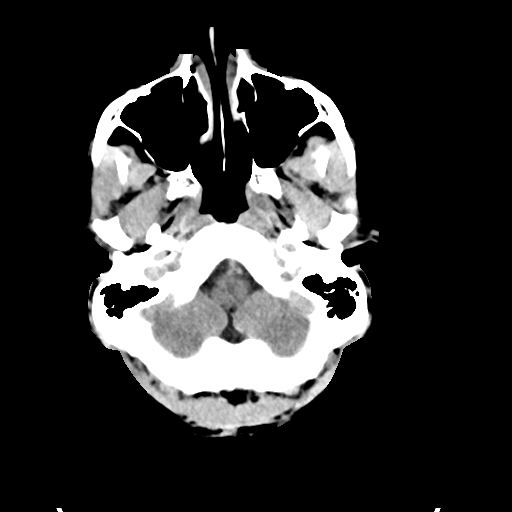
[im 5/35  bone]
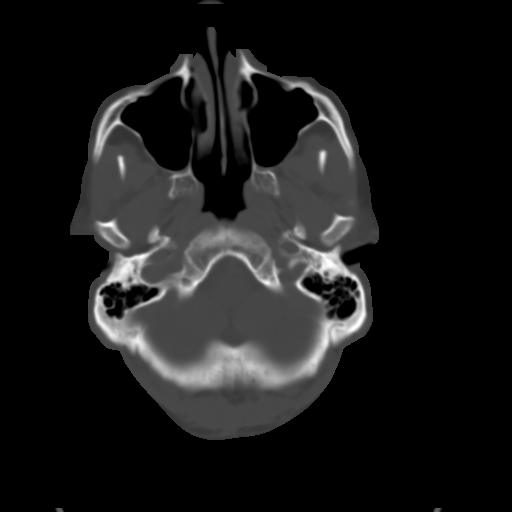
[im 9/35  brain]
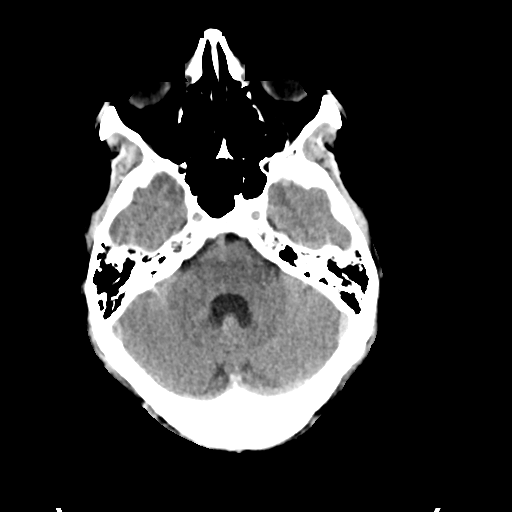
[im 13/35  brain]
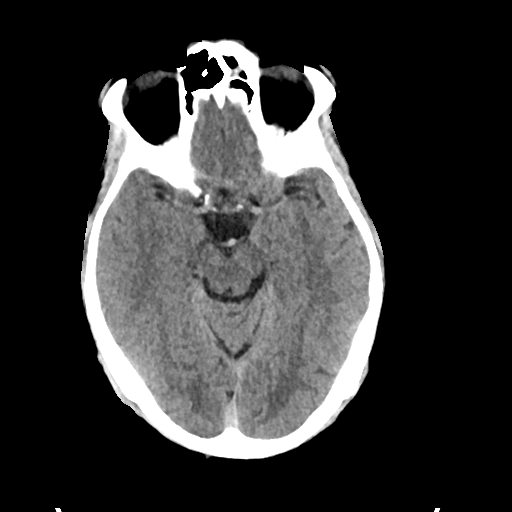
[im 18/35  brain]
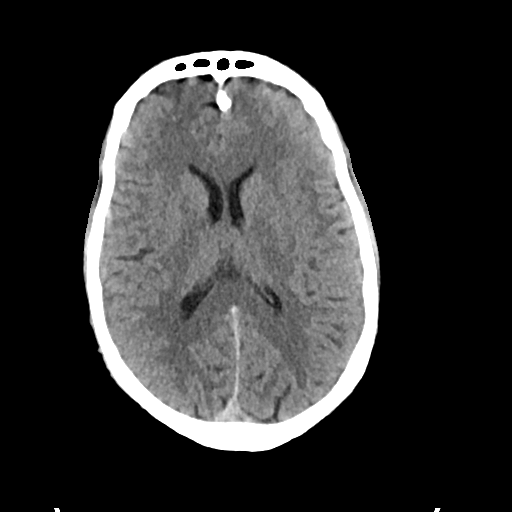
[im 22/35  brain]
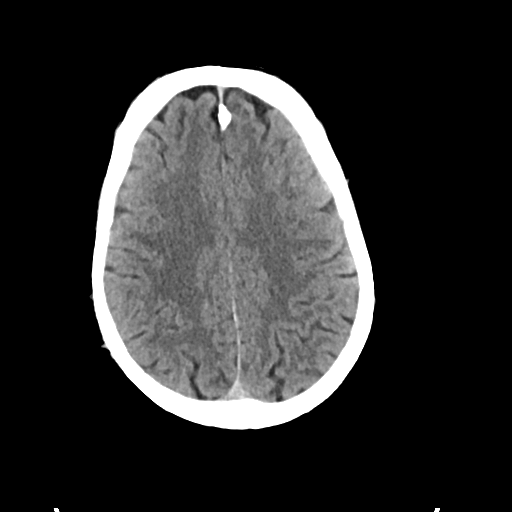
[im 22/35  bone]
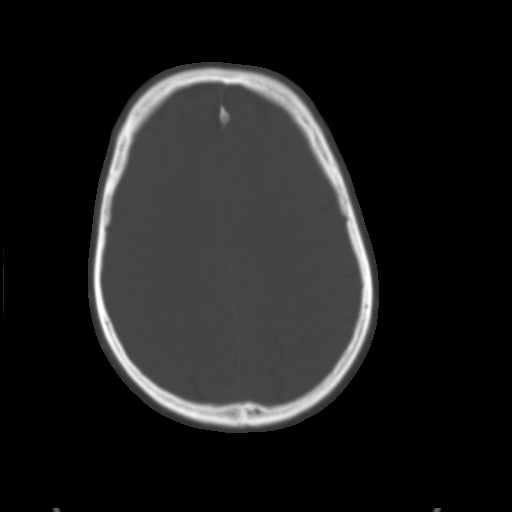
[im 26/35  brain]
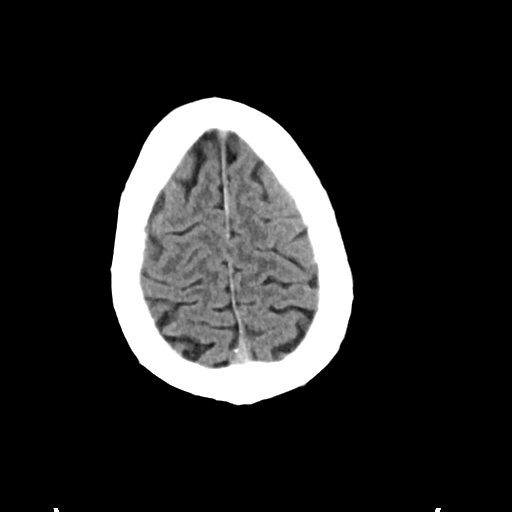
[im 30/35  brain]
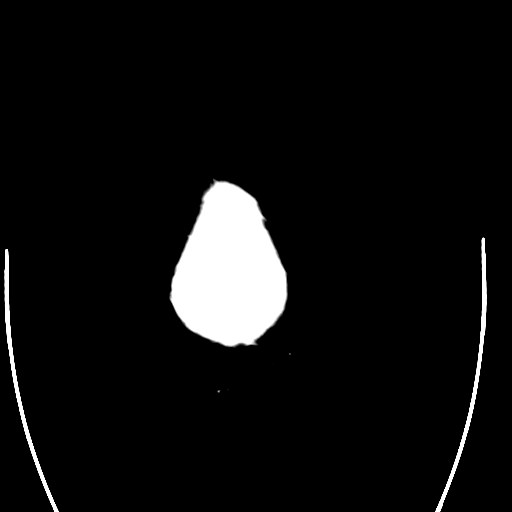

[Series 4: head bone · axial · 0.47mm/px · z∈[+1339,+1357]mm · 2 of 86 slices shown]
[im 9/86  bone]
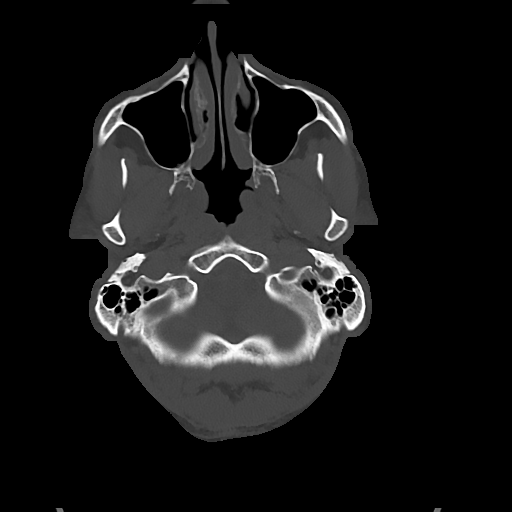
[im 18/86  bone]
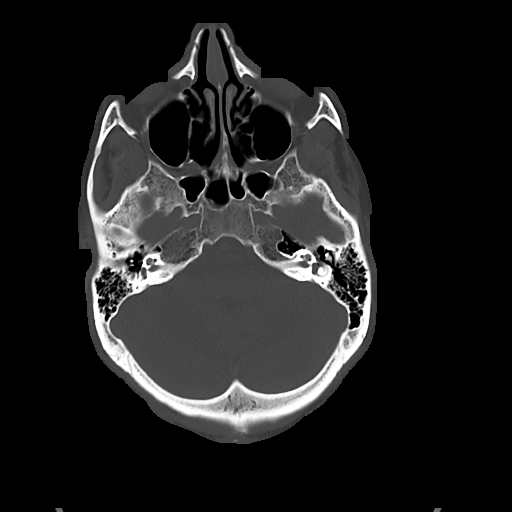

[Series 5: cor soft · coronal · 0.33mm/px · 3 of 74 slices shown]
[im 25/74  brain]
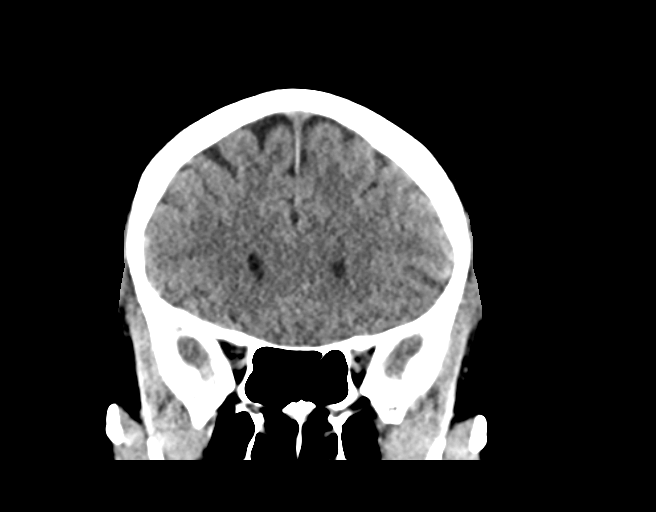
[im 33/74  brain]
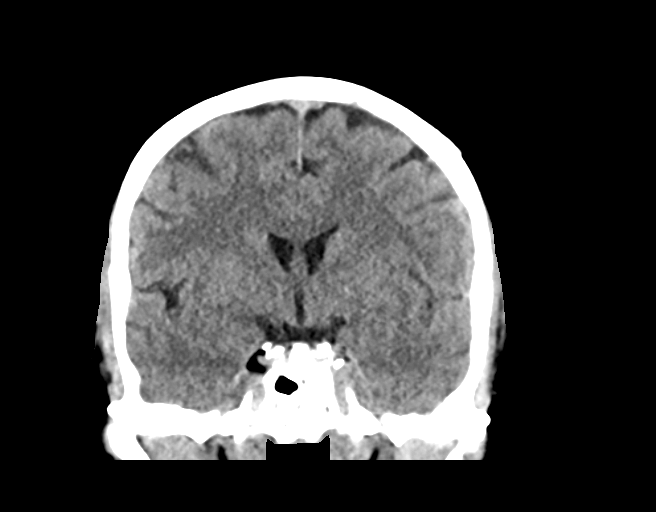
[im 41/74  brain]
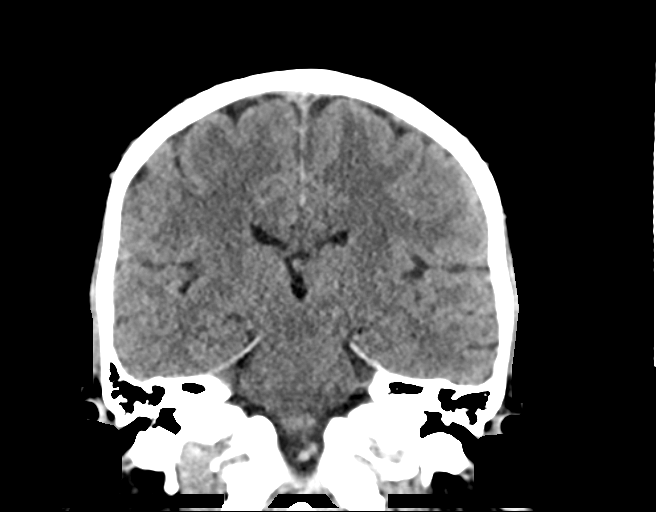

[Series 6: sag soft · sagittal · 0.33mm/px · 3 of 67 slices shown]
[im 23/67  brain]
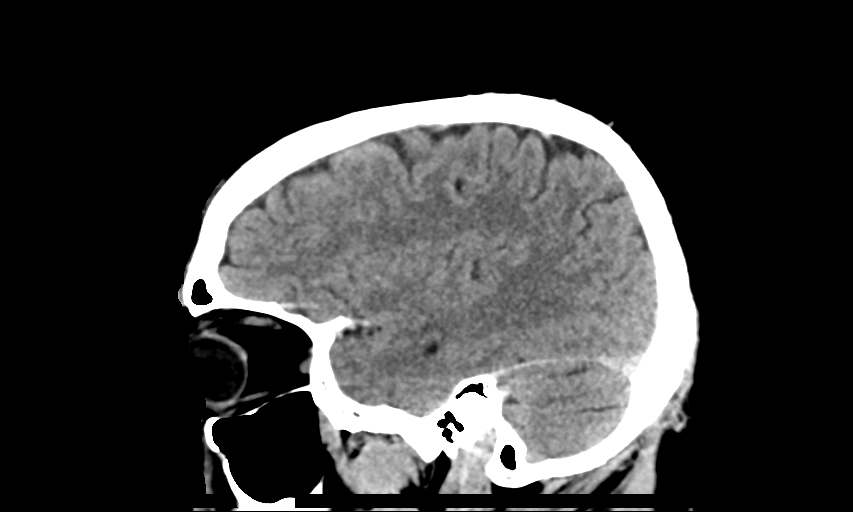
[im 34/67  brain]
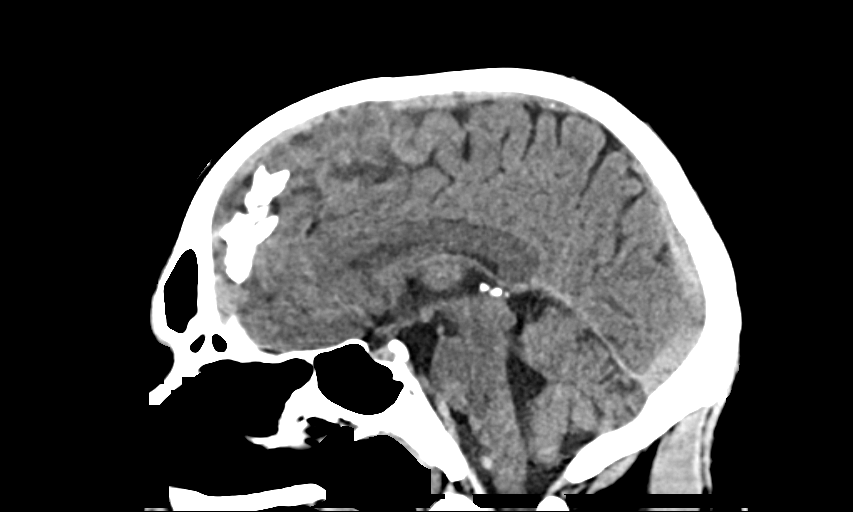
[im 45/67  brain]
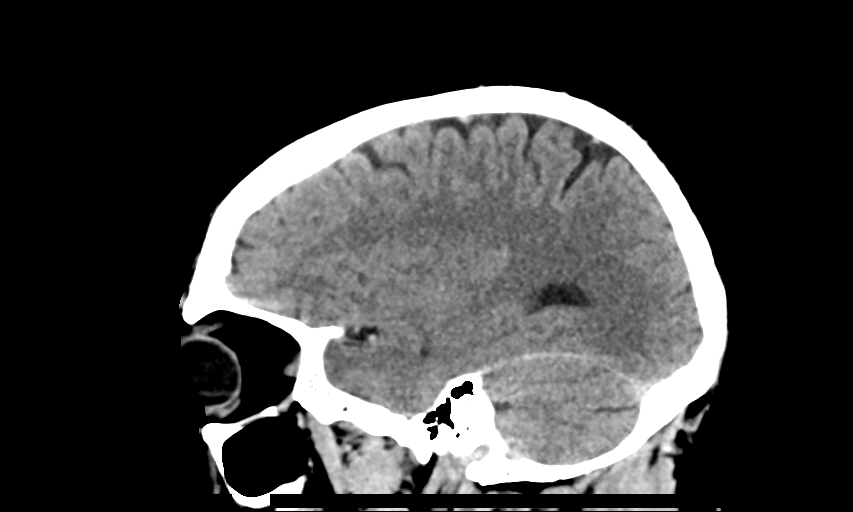

[15 of 47 positions shown; findings below may reference images not displayed]

FINDINGS: Brain: No evidence of acute infarction, hemorrhage, hydrocephalus,
extra-axial collection or mass lesion/mass effect.

Vascular: No hyperdense vessel or unexpected calcification.

Skull: Normal. Negative for fracture or focal lesion.

Sinuses/Orbits: There is mild left frontal sinus mucosal thickening.
Otherwise, the remaining paranasal sinuses and mastoid air cells are
essentially clear.

Other: None.
IMPRESSION: No acute intracranial abnormality. No significant interval change
from 04/25/2019.

## 2020-12-10 ENCOUNTER — Other Ambulatory Visit: Payer: Self-pay

## 2020-12-10 ENCOUNTER — Ambulatory Visit
Admission: EM | Admit: 2020-12-10 | Discharge: 2020-12-10 | Disposition: A | Discharge status: Home / Self Care | Payer: Non-veteran care | Attending: Emergency Medicine | Admitting: Emergency Medicine

## 2020-12-10 DIAGNOSIS — J029 Acute pharyngitis, unspecified: Secondary | ICD-10-CM | POA: Diagnosis not present

## 2020-12-10 DIAGNOSIS — Z20822 Contact with and (suspected) exposure to covid-19: Secondary | ICD-10-CM | POA: Diagnosis not present

## 2020-12-10 NOTE — ED Triage Notes (Signed)
Pt c/o scratchy throat and runny nose since Monday. States positive covid exposure.
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
## 2020-12-11 LAB — SARS-COV-2, NAA 2 DAY TAT

## 2020-12-11 LAB — NOVEL CORONAVIRUS, NAA: SARS-CoV-2, NAA: NOT DETECTED

## 2021-10-01 ENCOUNTER — Encounter: Payer: Self-pay | Admitting: Emergency Medicine

## 2021-10-01 ENCOUNTER — Ambulatory Visit
Admission: EM | Admit: 2021-10-01 | Discharge: 2021-10-01 | Disposition: A | Discharge status: Home / Self Care | Payer: Non-veteran care | Attending: Internal Medicine | Admitting: Internal Medicine

## 2021-10-01 ENCOUNTER — Other Ambulatory Visit: Payer: Self-pay

## 2021-10-01 DIAGNOSIS — K047 Periapical abscess without sinus: Secondary | ICD-10-CM | POA: Diagnosis not present

## 2021-10-01 MED ORDER — AMOXICILLIN-POT CLAVULANATE 875-125 MG PO TABS: 1.0000 | ORAL_TABLET | Freq: Two times a day (BID) | ORAL | 0 refills | Status: DC

## 2021-10-01 MED ORDER — KETOROLAC TROMETHAMINE 60 MG/2ML IM SOLN
30.0000 mg | Freq: Once | INTRAMUSCULAR | Status: AC
  Administered 2021-10-01: 30 mg via INTRAMUSCULAR

## 2021-10-01 NOTE — ED Triage Notes (Signed)
Patient c/o dental pain in upper and lower gums since Monday, believes he may have a dental abscess. Overtly poor dentition on inspection. Has been using Amisol over the counter and rubbing it over his gums to try to ease the pain without relief.

## 2021-10-01 NOTE — Discharge Instructions (Addendum)
Please take medications as prescribed You can take over-the-counter ibuprofen 600 mg every 6 hours as needed for pain You need dental evaluation for further management.
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
## 2022-04-15 ENCOUNTER — Other Ambulatory Visit: Payer: Self-pay

## 2022-04-15 ENCOUNTER — Inpatient Hospital Stay (HOSPITAL_BASED_OUTPATIENT_CLINIC_OR_DEPARTMENT_OTHER)
Admission: EM | Admit: 2022-04-15 | Discharge: 2022-04-17 | DRG: 872 | Disposition: A | Discharge status: Home Health | Payer: No Typology Code available for payment source | Attending: Internal Medicine | Admitting: Internal Medicine

## 2022-04-15 ENCOUNTER — Inpatient Hospital Stay (HOSPITAL_COMMUNITY): Payer: No Typology Code available for payment source

## 2022-04-15 ENCOUNTER — Emergency Department (HOSPITAL_BASED_OUTPATIENT_CLINIC_OR_DEPARTMENT_OTHER): Payer: No Typology Code available for payment source

## 2022-04-15 ENCOUNTER — Encounter (HOSPITAL_BASED_OUTPATIENT_CLINIC_OR_DEPARTMENT_OTHER): Payer: Self-pay

## 2022-04-15 DIAGNOSIS — I2510 Atherosclerotic heart disease of native coronary artery without angina pectoris: Secondary | ICD-10-CM | POA: Diagnosis present

## 2022-04-15 DIAGNOSIS — R569 Unspecified convulsions: Secondary | ICD-10-CM | POA: Diagnosis not present

## 2022-04-15 DIAGNOSIS — N3000 Acute cystitis without hematuria: Principal | ICD-10-CM

## 2022-04-15 DIAGNOSIS — A419 Sepsis, unspecified organism: Secondary | ICD-10-CM | POA: Diagnosis present

## 2022-04-15 DIAGNOSIS — Z8249 Family history of ischemic heart disease and other diseases of the circulatory system: Secondary | ICD-10-CM | POA: Diagnosis not present

## 2022-04-15 DIAGNOSIS — N390 Urinary tract infection, site not specified: Secondary | ICD-10-CM

## 2022-04-15 DIAGNOSIS — I10 Essential (primary) hypertension: Secondary | ICD-10-CM | POA: Diagnosis not present

## 2022-04-15 DIAGNOSIS — R3911 Hesitancy of micturition: Secondary | ICD-10-CM | POA: Diagnosis present

## 2022-04-15 DIAGNOSIS — R7989 Other specified abnormal findings of blood chemistry: Secondary | ICD-10-CM | POA: Diagnosis present

## 2022-04-15 DIAGNOSIS — N179 Acute kidney failure, unspecified: Secondary | ICD-10-CM

## 2022-04-15 DIAGNOSIS — G40909 Epilepsy, unspecified, not intractable, without status epilepticus: Secondary | ICD-10-CM | POA: Diagnosis present

## 2022-04-15 DIAGNOSIS — Z20822 Contact with and (suspected) exposure to covid-19: Secondary | ICD-10-CM | POA: Diagnosis present

## 2022-04-15 DIAGNOSIS — N1831 Chronic kidney disease, stage 3a: Secondary | ICD-10-CM | POA: Diagnosis present

## 2022-04-15 DIAGNOSIS — R748 Abnormal levels of other serum enzymes: Secondary | ICD-10-CM

## 2022-04-15 DIAGNOSIS — B9620 Unspecified Escherichia coli [E. coli] as the cause of diseases classified elsewhere: Secondary | ICD-10-CM | POA: Diagnosis present

## 2022-04-15 DIAGNOSIS — Z79899 Other long term (current) drug therapy: Secondary | ICD-10-CM

## 2022-04-15 DIAGNOSIS — I129 Hypertensive chronic kidney disease with stage 1 through stage 4 chronic kidney disease, or unspecified chronic kidney disease: Secondary | ICD-10-CM | POA: Diagnosis present

## 2022-04-15 DIAGNOSIS — N183 Chronic kidney disease, stage 3 unspecified: Secondary | ICD-10-CM | POA: Diagnosis present

## 2022-04-15 DIAGNOSIS — R6520 Severe sepsis without septic shock: Secondary | ICD-10-CM | POA: Diagnosis present

## 2022-04-15 LAB — CBC WITH DIFFERENTIAL/PLATELET
Abs Immature Granulocytes: 0.02 10*3/uL (ref 0.00–0.07)
Basophils Absolute: 0 10*3/uL (ref 0.0–0.1)
Basophils Relative: 0 %
Eosinophils Absolute: 0 10*3/uL (ref 0.0–0.5)
Eosinophils Relative: 0 %
HCT: 40.2 % (ref 39.0–52.0)
Hemoglobin: 13.4 g/dL (ref 13.0–17.0)
Immature Granulocytes: 0 %
Lymphocytes Relative: 8 %
Lymphs Abs: 0.5 10*3/uL — ABNORMAL LOW (ref 0.7–4.0)
MCH: 31.2 pg (ref 26.0–34.0)
MCHC: 33.3 g/dL (ref 30.0–36.0)
MCV: 93.7 fL (ref 80.0–100.0)
Monocytes Absolute: 0.4 10*3/uL (ref 0.1–1.0)
Monocytes Relative: 7 %
Neutro Abs: 5 10*3/uL (ref 1.7–7.7)
Neutrophils Relative %: 85 %
Platelets: 151 10*3/uL (ref 150–400)
RBC: 4.29 MIL/uL (ref 4.22–5.81)
RDW: 13.2 % (ref 11.5–15.5)
WBC: 5.9 10*3/uL (ref 4.0–10.5)
nRBC: 0 % (ref 0.0–0.2)

## 2022-04-15 LAB — URINALYSIS, ROUTINE W REFLEX MICROSCOPIC
Bilirubin Urine: NEGATIVE
Glucose, UA: NEGATIVE mg/dL
Nitrite: NEGATIVE
Protein, ur: 100 mg/dL — AB
Specific Gravity, Urine: 1.019 (ref 1.005–1.030)
WBC, UA: >50 WBC/hpf — ABNORMAL HIGH (ref 0–5)
pH: 7.5 (ref 5.0–8.0)

## 2022-04-15 LAB — RESP PANEL BY RT-PCR (FLU A&B, COVID) ARPGX2
Influenza A by PCR: NEGATIVE
Influenza B by PCR: NEGATIVE
SARS Coronavirus 2 by RT PCR: NEGATIVE

## 2022-04-15 LAB — COMPREHENSIVE METABOLIC PANEL
ALT: 226 U/L — ABNORMAL HIGH (ref 0–44)
AST: 122 U/L — ABNORMAL HIGH (ref 15–41)
Albumin: 3.8 g/dL (ref 3.5–5.0)
Alkaline Phosphatase: 112 U/L (ref 38–126)
Anion gap: 12 (ref 5–15)
BUN: 23 mg/dL (ref 8–23)
CO2: 21 mmol/L — ABNORMAL LOW (ref 22–32)
Calcium: 8.9 mg/dL (ref 8.9–10.3)
Chloride: 104 mmol/L (ref 98–111)
Creatinine, Ser: 1.86 mg/dL — ABNORMAL HIGH (ref 0.61–1.24)
GFR, Estimated: 40 mL/min — ABNORMAL LOW (ref >60)
Glucose, Bld: 118 mg/dL — ABNORMAL HIGH (ref 70–99)
Potassium: 3.7 mmol/L (ref 3.5–5.1)
Sodium: 137 mmol/L (ref 135–145)
Total Bilirubin: 0.4 mg/dL (ref 0.3–1.2)
Total Protein: 7 g/dL (ref 6.5–8.1)

## 2022-04-15 LAB — HEPATITIS PANEL, ACUTE
HCV Ab: NONREACTIVE
Hep A IgM: NONREACTIVE
Hep B C IgM: NONREACTIVE
Hepatitis B Surface Ag: NONREACTIVE

## 2022-04-15 LAB — APTT: aPTT: 30 seconds (ref 24–36)

## 2022-04-15 LAB — PROTIME-INR
INR: 1 (ref 0.8–1.2)
Prothrombin Time: 12.8 seconds (ref 11.4–15.2)

## 2022-04-15 LAB — LACTIC ACID, PLASMA: Lactic Acid, Venous: 1.5 mmol/L (ref 0.5–1.9)

## 2022-04-15 LAB — VALPROIC ACID LEVEL: Valproic Acid Lvl: 23 ug/mL — ABNORMAL LOW (ref 50.0–100.0)

## 2022-04-15 MED ORDER — SODIUM CHLORIDE 0.9 % IV SOLN
2.0000 g | Freq: Two times a day (BID) | INTRAVENOUS | Status: DC
  Administered 2022-04-15 – 2022-04-17 (×4): 2 g via INTRAVENOUS
  Filled 2022-04-15 (×4): qty 12.5

## 2022-04-15 MED ORDER — METRONIDAZOLE 500 MG/100ML IV SOLN
500.0000 mg | Freq: Once | INTRAVENOUS | Status: AC
  Administered 2022-04-15: 500 mg via INTRAVENOUS
  Filled 2022-04-15: qty 100

## 2022-04-15 MED ORDER — LACTATED RINGERS IV BOLUS (SEPSIS)
1000.0000 mL | Freq: Once | INTRAVENOUS | Status: AC
  Administered 2022-04-15: 1000 mL via INTRAVENOUS

## 2022-04-15 MED ORDER — ACETAMINOPHEN 325 MG PO TABS
325.0000 mg | ORAL_TABLET | Freq: Four times a day (QID) | ORAL | Status: DC | PRN
  Administered 2022-04-16: 325 mg via ORAL
  Filled 2022-04-15: qty 1

## 2022-04-15 MED ORDER — ONDANSETRON HCL 4 MG PO TABS: 4.0000 mg | ORAL_TABLET | Freq: Four times a day (QID) | ORAL | Status: DC | PRN

## 2022-04-15 MED ORDER — OXYCODONE HCL 5 MG PO TABS: 5.0000 mg | ORAL_TABLET | ORAL | Status: DC | PRN

## 2022-04-15 MED ORDER — LACTATED RINGERS IV SOLN: INTRAVENOUS | Status: AC

## 2022-04-15 MED ORDER — ACETAMINOPHEN 650 MG RE SUPP
325.0000 mg | Freq: Four times a day (QID) | RECTAL | Status: DC | PRN
  Administered 2022-04-15: 325 mg via RECTAL
  Filled 2022-04-15: qty 1

## 2022-04-15 MED ORDER — ACETAMINOPHEN 325 MG PO TABS
650.0000 mg | ORAL_TABLET | Freq: Four times a day (QID) | ORAL | Status: DC | PRN
  Administered 2022-04-15: 650 mg via ORAL
  Filled 2022-04-15: qty 2

## 2022-04-15 MED ORDER — LEVETIRACETAM 500 MG PO TABS
1000.0000 mg | ORAL_TABLET | Freq: Two times a day (BID) | ORAL | Status: DC
  Administered 2022-04-15 – 2022-04-17 (×5): 1000 mg via ORAL
  Filled 2022-04-15 (×5): qty 2

## 2022-04-15 MED ORDER — VANCOMYCIN HCL 1250 MG/250ML IV SOLN
1250.0000 mg | INTRAVENOUS | Status: DC
  Administered 2022-04-16 – 2022-04-17 (×2): 1250 mg via INTRAVENOUS
  Filled 2022-04-15 (×2): qty 250

## 2022-04-15 MED ORDER — VANCOMYCIN HCL IN DEXTROSE 1-5 GM/200ML-% IV SOLN
1000.0000 mg | INTRAVENOUS | Status: AC
  Administered 2022-04-15 (×2): 1000 mg via INTRAVENOUS
  Filled 2022-04-15: qty 200

## 2022-04-15 MED ORDER — SODIUM CHLORIDE 0.9 % IV SOLN
2.0000 g | Freq: Once | INTRAVENOUS | Status: AC
  Administered 2022-04-15: 2 g via INTRAVENOUS
  Filled 2022-04-15: qty 12.5

## 2022-04-15 MED ORDER — ONDANSETRON HCL 4 MG/2ML IJ SOLN
4.0000 mg | Freq: Four times a day (QID) | INTRAMUSCULAR | Status: DC | PRN
  Administered 2022-04-16: 4 mg via INTRAVENOUS
  Filled 2022-04-15: qty 2

## 2022-04-15 MED ORDER — LACTATED RINGERS IV BOLUS (SEPSIS)
500.0000 mL | Freq: Once | INTRAVENOUS | Status: AC
  Administered 2022-04-15: 500 mL via INTRAVENOUS

## 2022-04-15 MED ORDER — VANCOMYCIN HCL IN DEXTROSE 1-5 GM/200ML-% IV SOLN
1000.0000 mg | Freq: Once | INTRAVENOUS | Status: DC
  Filled 2022-04-15: qty 200

## 2022-04-15 NOTE — Sepsis Progress Note (Signed)
Elink following Code Sepsis  Per Bedside RN One set of BC collected before start of abx & second set was collected after start of abx

## 2022-04-15 NOTE — Progress Notes (Addendum)
Pharmacy attempted reviewing medications with patient on admission.  According to Pharmacy tech, the patient was unable to tell the medications he is on and the last time he took them.  Patient's wife is at bedside, according to the patient's wife, the patient does not remember the names because he just knows them by their appearance and uses a pillbox.  According to the patient and his wife, they don't have anyone at this time who can bring his medications in.  Wife states that she is blind and cannot help the pt with anything.  Will consult TOC for possible discharge needs.

## 2022-04-15 NOTE — Progress Notes (Signed)
   04/15/22 1034  Assess: MEWS Score  Temp (!) 103.1 F (39.5 C)  BP 114/68  Pulse Rate (!) 109  Resp 20  Level of Consciousness Alert  SpO2 93 %  O2 Device Room Air  Assess: MEWS Score  MEWS Temp 2  MEWS Systolic 0  MEWS Pulse 1  MEWS RR 0  MEWS LOC 0  MEWS Score 3  MEWS Score Color Yellow  Assess: if the MEWS score is Yellow or Red  Were vital signs taken at a resting state? Yes  Focused Assessment No change from prior assessment  Does the patient meet 2 or more of the SIRS criteria? Yes  Does the patient have a confirmed or suspected source of infection? Yes  Provider and Rapid Response Notified? Yes (Dr Ronaldo Miyamoto at bedside)  MEWS guidelines implemented *See Row Information* Yes  Treat  MEWS Interventions Administered scheduled meds/treatments;Administered prn meds/treatments  Pain Scale 0-10  Pain Score 0  Take Vital Signs  Increase Vital Sign Frequency  Yellow: Q 2hr X 2 then Q 4hr X 2, if remains yellow, continue Q 4hrs  Escalate  MEWS: Escalate Yellow: discuss with charge nurse/RN and consider discussing with provider and RRT  Notify: Charge Nurse/RN  Name of Charge Nurse/RN Notified Research scientist (physical sciences)  Date Charge Nurse/RN Notified 04/15/22  Time Charge Nurse/RN Notified 1233  Notify: Provider  Provider Name/Title Dr. Ronaldo Miyamoto  Date Provider Notified 04/15/22  Time Provider Notified 1035  Method of Notification Face-to-face  Notification Reason Other (Comment) (yellow MEWS)  Provider response At bedside  Date of Provider Response 04/15/22  Time of Provider Response 1035  Notify: Rapid Response  Name of Rapid Response RN Notified not applicable  Document  Patient Outcome Other (Comment) (pt is stable on admission, prn meds given for temperature)  Assess: SIRS CRITERIA  SIRS Temperature  1  SIRS Pulse 1  SIRS Respirations  0  SIRS WBC 0  SIRS Score Sum  2

## 2022-04-15 NOTE — Progress Notes (Signed)
Pharmacy Antibiotic Note  Raymond Cox is a 61 y.o. male admitted on 04/15/2022 with UTI and possible alternative source of infection as well.  Pharmacy has been consulted for Cefepime and Vancomycin dosing.  Plan: Cefepime 2g IV q12h Vancomycin 2g IV today then 1.25 g IV q24h.   (SCr 1.86, est AUC 452) Measure Vanc levels as needed; Goal AUC = 400 - 550. Follow up renal function, culture results, and clinical course.   Height: 6\' 2"  (188 cm) Weight: 88 kg (194 lb 0.1 oz) IBW/kg (Calculated) : 82.2  Temp (24hrs), Avg:102.3 F (39.1 C), Min:100.2 F (37.9 C), Max:103.3 F (39.6 C)  Recent Labs  Lab 04/15/22 0412  WBC 5.9  CREATININE 1.86*  LATICACIDVEN 1.5    Estimated Creatinine Clearance: 47.3 mL/min (A) (by C-G formula based on SCr of 1.86 mg/dL (H)).    No Known Allergies  Antimicrobials this admission: 5/26 Cefepime 5/26 Metronidazole x1 5/26 Vancomycin   Dose adjustments this admission:   Microbiology results: 5/26 BCx: 5/26 UCx:   Thank you for allowing pharmacy to be a part of this patient's care.  6/26 PharmD, BCPS Clinical Pharmacist [facility] main pharmacy [phone] 04/15/2022 11:51 AM

## 2022-04-15 NOTE — Progress Notes (Signed)
Ice packs and cold wash rugs applied to forehead and arm pits.

## 2022-04-15 NOTE — Progress Notes (Signed)
Responded to call, pt seen having chills, shaking, pt said it is the same chills he has been having for the last few days.  It comes and goes.  Cold wash cloths given, reminded that thick blankets should not be used when running a fever.  Both patient and wife verbalized understanding.  Needs addressed.

## 2022-04-15 NOTE — Progress Notes (Signed)
Transition of Care [facility]) Screening Note  Patient Details  Name: RUSTON FEDORA Date of Birth: 1961-08-22  Transition of Care [facility]) CM/SW Contact:    Ewing Schlein, LCSW Phone Number: 04/15/2022, 1:35 PM  Transition of Care Department [facility]) has reviewed patient and no TOC needs have been identified at this time. We will continue to monitor patient advancement through interdisciplinary progression rounds. If new patient transition needs arise, please place a TOC consult.

## 2022-04-15 NOTE — ED Notes (Signed)
Ice bags have already been repositions twice. Pt shaking due to chills. Ice removed  fever slowly;y going down. MD notified

## 2022-04-15 NOTE — ED Notes (Signed)
Pt stood to side of bed to use urinal. Urine specimen obtained.

## 2022-04-15 NOTE — H&P (Signed)
History and Physical    Patient: Raymond Cox [id number] DOB: 08-11-1961 DOA: 04/15/2022 DOS: the patient was seen and examined on 04/15/2022 PCP: [facility]  Patient coming from: Home  Chief Complaint:  Chief Complaint  Patient presents with   Fever   HPI: Raymond Cox is a 61 y.o. male with medical history significant of HTN, CKD3a, seizure d/o. Presenting with fevers and chills. His wife reports that he started having fevers about a week ago. They ranged from 99 - 103. He has had accompanying chills; but no nausea, vomiting, diarrhea, cough or respiratory distress. He has not had any sick contacts. He has had urinary hesitancy but no burning or change in urine appearance. He has taken APAP which has provided temporary relief. When his symptoms did not improve last night, he decided to come to the ED for assistance.   Review of Systems: As mentioned in the history of present illness. All other systems reviewed and are negative. Past Medical History:  Diagnosis Date   Coronary artery disease    Hypertension    Renal disorder    stage 3 kidney disease    Seizures (HCC)    Past Surgical History:  Procedure Laterality Date   ABDOMINAL SURGERY     Wife states that patient had history of intestinal resection, but does not know the details.   Left leg surgery     Social History:  reports that he has never smoked. He has never used smokeless tobacco. He reports that he does not currently use alcohol. He reports that he does not use drugs.  No Known Allergies  Family History  Problem Relation Age of Onset   Hypertension Mother    Hyperlipidemia Mother    Prostate cancer Father    Hyperlipidemia Sister    Hypertension Sister    Syncope episode Brother        Wife states that patient's brother has syncope   Deep vein thrombosis Brother     Prior to Admission medications   Medication Sig Start Date End Date Taking? Authorizing Provider  amoxicillin-clavulanate  (AUGMENTIN) 875-125 MG tablet Take 1 tablet by mouth every 12 (twelve) hours. 10/01/21   Chase Picket, MD  Cholecalciferol (VITAMIN D3) 50 MCG (2000 UT) TABS Take 2,000 Units by mouth daily.    [provider]  diltiazem (DILACOR XR) 240 MG 24 hr capsule Take 240 mg by mouth daily.    [provider]  divalproex (DEPAKOTE) 500 MG DR tablet Take 2 tablets (1,000 mg total) by mouth every 12 (twelve) hours. 05/03/19 08/01/19  Cherylann Ratel A, DO  hydrALAZINE (APRESOLINE) 25 MG tablet Take 1 tablet (25 mg total) by mouth every 8 (eight) hours. 05/03/19 08/01/19  Cherylann Ratel A, DO  levETIRAcetam (KEPPRA) 1000 MG tablet Take 1 tablet (1,000 mg total) by mouth 2 (two) times daily. 05/03/19 08/01/19  Cherylann Ratel A, DO  Oxcarbazepine (TRILEPTAL) 600 MG tablet Take 1 tablet (600 mg total) by mouth 2 (two) times daily. 05/03/19 08/01/19  Cherylann Ratel A, DO    Physical Exam: Vitals:   04/15/22 [id number] 04/15/22 0845 04/15/22 0915 04/15/22 0922  BP:      Pulse:  94 91   Resp:  (!) 21 15   Temp: (!) 103 F (39.4 C)   (!) 102.8 F (39.3 C)  TempSrc:    Oral  SpO2:  100% 96%   Weight:      Height:       General:  61 y.o. male resting in bed in NAD Eyes: PERRL, normal sclera ENMT: Nares patent w/o discharge, orophaynx clear, dentition normal, ears w/o discharge/lesions/ulcers Neck: Supple, trachea midline Cardiovascular: RRR, +S1, S2, no m/g/r, equal pulses throughout Respiratory: CTABL, no w/r/r, normal WOB GI: BS+, NDNT, no masses noted, no organomegaly noted MSK: No e/c/c Neuro: A&O x 3, no focal deficits Psyc: Appropriate interaction and affect, calm/cooperative  Data Reviewed:  Na+  137 CO2  21 Glucose  118 Scr  1.86 AST  122 ALT  226  CXR: Negative portable chest.  Assessment and Plan: Possible UTI Sepsis secondary to above     - admit to inpt, tele     - started on vanc/cefepime/flagyl; I think we can drop the flagyl for right now; will continue vanc/cefepime, but  look for deescalation soon     - follow bld cx, Ucx     - CXR is negative     - lactic acid is normal     - fluids  CKD3a Urinary Hesitancy     - gets care through [place], so no recent values to compare to; but as off 2020 his Scr ranged from 1.5 to 1.7 and he was CKD3a at the time; today he is 1.86     - watch nephrotoxins     - check renal US     - fluids  Elevated LFTs     - check hepatitis panel     - check RUQ ab [place]     - hepatocellular pattern; follow     - right now, using reduced dose APAP, if LFTs worsen, will need to ibuprofen for fever control (however, that option become problematic with his renal function)  Seizure disorder     - continue home regimen when confirmed  HTN     - continue home regimen when confirmed     - will have PRNs available  Advance Care Planning:   Code Status: FULL  Consults: None  Family Communication: w/ family at bedside  Severity of Illness: The appropriate patient status for this patient is INPATIENT. Inpatient status is judged to be reasonable and necessary in order to provide the required intensity of service to ensure the patient's safety. The patient's presenting symptoms, physical exam findings, and initial radiographic and laboratory data in the context of their chronic comorbidities is felt to place them at high risk for further clinical deterioration. Furthermore, it is not anticipated that the patient will be medically stable for discharge from the hospital within 2 midnights of admission.   * I certify that at the point of admission it is my clinical judgment that the patient will require inpatient hospital care spanning beyond 2 midnights from the point of admission due to high intensity of service, high risk for further deterioration and high frequency of surveillance required.*  Author: Jonnie Finner, DO 04/15/2022 10:26 AM  For on call review [email or web address].

## 2022-04-15 NOTE — ED Triage Notes (Signed)
Arrives via [place] with c/o shaking and fever. Evaluated by EMS at residence and told VS were abnormal and needed medical treatment but wanted to transport to another facility which pt declined. EMS administered 1000mg  tylenol pta.   Pt sts fever, and shaking x 6 days. Mild cough and dysuria. Unable to answer additional questions.

## 2022-04-15 NOTE — Progress Notes (Signed)
Received patient from [facility] via EMS. Patient is alert and oriented x 4.  Patient with chills, stating "I am shaking really bad, I feel bad".  Assisted in bed in position of comfort.  Temp on admission 103.1 orally.  Needs addressed.  Will monitor.

## 2022-04-15 NOTE — ED Notes (Signed)
MD notified of Temp. Ice packs placed on Pt

## 2022-04-15 NOTE — ED Notes (Signed)
Report given to [facility] 

## 2022-04-15 NOTE — Plan of Care (Signed)

## 2022-04-15 NOTE — ED Provider Notes (Signed)
[facility] EMERGENCY DEPT  Provider Note  CSN: [id number] Arrival date & time: 04/15/22 0348  History Chief Complaint  Patient presents with   Fever    Raymond Cox is a 61 y.o. 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 He has had shaking chills and fevers for the last 5-6 days. Has not sought treatment due to 'being stubborn. He has not had much cough, some dysuria. No vomiting or diarrhea. He has had body aches/myalgias. He has not had any recent seizures, family reports compliance with medications. They had called EMS earlier this evening and they recommend transport to the hospital, however they did not want to go to [facility] or [facility] due to concerns over wait times, so after getting 1000mg  of APAP, they refused transport and came here on their own.    Home Medications Prior to Admission medications   Medication Sig Start Date End Date Taking? Authorizing Provider  amoxicillin-clavulanate (AUGMENTIN) 875-125 MG tablet Take 1 tablet by mouth every 12 (twelve) hours. 10/01/21   13/11/22, MD  Cholecalciferol (VITAMIN D3) 50 MCG (2000 UT) TABS Take 2,000 Units by mouth daily.    [provider]  diltiazem (DILACOR XR) 240 MG 24 hr capsule Take 240 mg by mouth daily.    [provider]  divalproex (DEPAKOTE) 500 MG DR tablet Take 2 tablets (1,000 mg total) by mouth every 12 (twelve) hours. 05/03/19 08/01/19  10/01/19 A, DO  hydrALAZINE (APRESOLINE) 25 MG tablet Take 1 tablet (25 mg total) by mouth every 8 (eight) hours. 05/03/19 08/01/19  10/01/19 A, DO  levETIRAcetam (KEPPRA) 1000 MG tablet Take 1 tablet (1,000 mg total) by mouth 2 (two) times daily. 05/03/19 08/01/19  10/01/19 A, DO  Oxcarbazepine (TRILEPTAL) 600 MG tablet Take 1 tablet (600 mg total) by mouth 2 (two) times daily. 05/03/19 08/01/19  10/01/19 A, DO      Allergies    Patient has no known allergies.   Review of Systems   Review of Systems Please see HPI for pertinent positives and negatives  Physical Exam BP (!) 156/74   Pulse 94   Temp 100.2 F (37.9 C) (Oral)   Resp 19   Ht 6\' 2"  (1.88 m)   Wt 81.6 kg   SpO2 92%   BMI 23.11 kg/m   Physical Exam Vitals and nursing note reviewed.  Constitutional:      Appearance: Normal appearance.     Comments: Rigors/shaking chills  HENT:     Head: Normocephalic and atraumatic.     Nose: Nose normal.     Mouth/Throat:     Mouth: Mucous membranes are moist.  Eyes:     Extraocular Movements: Extraocular movements intact.     Conjunctiva/sclera: Conjunctivae normal.  Cardiovascular:     Rate and Rhythm: Tachycardia present.  Pulmonary:     Effort: Pulmonary effort is normal.     Breath sounds: Normal breath sounds. No wheezing or rales.  Abdominal:     General: Abdomen is flat.     Palpations: Abdomen is soft.     Tenderness: There is no abdominal tenderness.  Musculoskeletal:        General: No swelling. Normal range of motion.     Cervical back: Normal range of motion and neck supple. No rigidity.  Skin:    General: Skin is warm and dry.  Neurological:     General: No focal deficit present.     Mental Status: He is alert.  Psychiatric:        Mood and Affect: Mood normal.    ED Results / Procedures / Treatments   EKG EKG Interpretation  Date/Time:  Friday Apr 15 2022 04:10:33 EDT Ventricular Rate:  107 PR Interval:  111 QRS Duration: 70 QT Interval:  322 QTC Calculation: 430 R Axis:   65 Text Interpretation: Sinus tachycardia LAE, consider biatrial enlargement Minimal ST depression, inferior leads Since last tracing Rate faster Confirmed by Susy Frizzle [phone]) on 04/15/2022 4:17:20 AM  Procedures Procedures  Medications Ordered in the ED Medications  lactated ringers infusion (has no administration in time range)  lactated ringers bolus 1,000 mL  (1,000 mLs Intravenous New Bag/Given 04/15/22 0428)    And  lactated ringers bolus 1,000 mL (1,000 mLs Intravenous New Bag/Given 04/15/22 0433)    And  lactated ringers bolus 500 mL (has no administration in time range)  vancomycin (VANCOCIN) IVPB 1000 mg/200 mL premix (1,000 mg Intravenous New Bag/Given 04/15/22 0537)  ceFEPIme (MAXIPIME) 2 g in sodium chloride 0.9 % 100 mL IVPB (0 g Intravenous Stopped 04/15/22 0436)  metroNIDAZOLE (FLAGYL) IVPB 500 mg (0 mg Intravenous Stopped 04/15/22 0536)    Initial Impression and Plan  Patient here with several days of persistent fevers. May be UTI, based on history. Will initiate sepsis protocol including labs, CXR, EKG, IVF and empiric broad spectrum antibiotics.   ED Course   Clinical Course as of 04/15/22 0550  Fri Apr 15, 2022  0448 CBC is normal.  I personally viewed the images from radiology studies and agree with radiologist interpretation: CXR is clear.   [CS]  0515 Covid is negative. CMP with mild increase in Cr from baseline. Liver enzymes elevated. Depakote is subtherapeutic. Lactic acid is normal.  [CS]  0516 Temp and HR improving.  [CS]  0520 Coags normal.  [CS]  0531 UA is positive for infection.  [CS]  [id number] Patient reports he is feeling some better. Will admit for further management of sepsis/pyelonephritis and elevated liver enzymes.  [CS]  [phone] Spoke with Dr. Toniann Fail, Hospitalist, who will accept for admission.  [CS]    Clinical Course User Index [CS] Pollyann Savoy, MD     MDM Rules/Calculators/A&P Medical Decision Making Problems Addressed: Acute cystitis without hematuria: acute illness or injury that poses a threat to life or bodily functions AKI (acute kidney injury) [facility]): acute illness or injury that poses a threat to life or bodily functions Elevated liver enzymes: acute illness or injury Sepsis without acute organ dysfunction, due to unspecified organism [facility]): acute illness or injury that poses a threat to  life or bodily functions  Amount and/or Complexity of Data Reviewed Labs: ordered. Decision-making details documented in ED Course. Radiology: ordered and independent interpretation performed. Decision-making details documented in ED Course. ECG/medicine tests: ordered and independent interpretation performed. Decision-making details documented in ED Course.  Risk Prescription drug management. Decision regarding hospitalization.  Critical Care Total time providing critical care: 45 minutes   Final Clinical Impression(s) / ED Diagnoses Final diagnoses:  Acute cystitis without hematuria  AKI (acute kidney injury) (HCC)  Sepsis without acute organ dysfunction, due to unspecified organism (HCC)  Elevated liver enzymes    Rx / DC Orders ED Discharge Orders     None        Pollyann Savoy, MD 04/15/22 [phone]

## 2022-04-16 DIAGNOSIS — R7989 Other specified abnormal findings of blood chemistry: Secondary | ICD-10-CM | POA: Diagnosis not present

## 2022-04-16 DIAGNOSIS — I10 Essential (primary) hypertension: Secondary | ICD-10-CM

## 2022-04-16 DIAGNOSIS — A419 Sepsis, unspecified organism: Secondary | ICD-10-CM | POA: Diagnosis not present

## 2022-04-16 DIAGNOSIS — N1831 Chronic kidney disease, stage 3a: Secondary | ICD-10-CM | POA: Diagnosis not present

## 2022-04-16 DIAGNOSIS — N390 Urinary tract infection, site not specified: Secondary | ICD-10-CM

## 2022-04-16 DIAGNOSIS — R569 Unspecified convulsions: Secondary | ICD-10-CM

## 2022-04-16 DIAGNOSIS — R3911 Hesitancy of micturition: Secondary | ICD-10-CM

## 2022-04-16 LAB — CBC
HCT: 38.1 % — ABNORMAL LOW (ref 39.0–52.0)
Hemoglobin: 12.5 g/dL — ABNORMAL LOW (ref 13.0–17.0)
MCH: 32.2 pg (ref 26.0–34.0)
MCHC: 32.8 g/dL (ref 30.0–36.0)
MCV: 98.2 fL (ref 80.0–100.0)
Platelets: 130 10*3/uL — ABNORMAL LOW (ref 150–400)
RBC: 3.88 MIL/uL — ABNORMAL LOW (ref 4.22–5.81)
RDW: 13.6 % (ref 11.5–15.5)
WBC: 9.7 10*3/uL (ref 4.0–10.5)
nRBC: 0 % (ref 0.0–0.2)

## 2022-04-16 LAB — COMPREHENSIVE METABOLIC PANEL
ALT: 245 U/L — ABNORMAL HIGH (ref 0–44)
AST: 146 U/L — ABNORMAL HIGH (ref 15–41)
Albumin: 2.8 g/dL — ABNORMAL LOW (ref 3.5–5.0)
Alkaline Phosphatase: 126 U/L (ref 38–126)
Anion gap: 9 (ref 5–15)
BUN: 26 mg/dL — ABNORMAL HIGH (ref 8–23)
CO2: 23 mmol/L (ref 22–32)
Calcium: 8.4 mg/dL — ABNORMAL LOW (ref 8.9–10.3)
Chloride: 108 mmol/L (ref 98–111)
Creatinine, Ser: 1.85 mg/dL — ABNORMAL HIGH (ref 0.61–1.24)
GFR, Estimated: 40 mL/min — ABNORMAL LOW (ref >60)
Glucose, Bld: 92 mg/dL (ref 70–99)
Potassium: 3.8 mmol/L (ref 3.5–5.1)
Sodium: 140 mmol/L (ref 135–145)
Total Bilirubin: 0.8 mg/dL (ref 0.3–1.2)
Total Protein: 6 g/dL — ABNORMAL LOW (ref 6.5–8.1)

## 2022-04-16 LAB — PROTIME-INR
INR: 1.2 (ref 0.8–1.2)
Prothrombin Time: 14.9 seconds (ref 11.4–15.2)

## 2022-04-16 LAB — CORTISOL-AM, BLOOD: Cortisol - AM: 18.4 ug/dL (ref 6.7–22.6)

## 2022-04-16 LAB — PROCALCITONIN: Procalcitonin: 87.25 ng/mL

## 2022-04-16 LAB — HIV ANTIBODY (ROUTINE TESTING W REFLEX): HIV Screen 4th Generation wRfx: NONREACTIVE

## 2022-04-16 MED ORDER — OXCARBAZEPINE 300 MG PO TABS: 600.0000 mg | ORAL_TABLET | Freq: Two times a day (BID) | ORAL | Status: DC

## 2022-04-16 MED ORDER — DILTIAZEM HCL ER 240 MG PO CP24
240.0000 mg | ORAL_CAPSULE | Freq: Every day | ORAL | Status: DC
Start: 2022-04-16 — End: 2022-04-16
  Filled 2022-04-16: qty 1

## 2022-04-16 MED ORDER — OXCARBAZEPINE 300 MG PO TABS
600.0000 mg | ORAL_TABLET | Freq: Two times a day (BID) | ORAL | Status: DC
  Administered 2022-04-16 – 2022-04-17 (×2): 600 mg via ORAL
  Filled 2022-04-16 (×2): qty 2

## 2022-04-16 MED ORDER — DILTIAZEM HCL ER COATED BEADS 240 MG PO CP24
240.0000 mg | ORAL_CAPSULE | Freq: Every day | ORAL | Status: DC
  Administered 2022-04-16 – 2022-04-17 (×2): 240 mg via ORAL
  Filled 2022-04-16 (×2): qty 1

## 2022-04-16 MED ORDER — DIVALPROEX SODIUM 250 MG PO DR TAB
1000.0000 mg | DELAYED_RELEASE_TABLET | Freq: Two times a day (BID) | ORAL | Status: DC
  Administered 2022-04-16 – 2022-04-17 (×2): 1000 mg via ORAL
  Filled 2022-04-16 (×2): qty 4

## 2022-04-16 MED ORDER — HYDRALAZINE HCL 50 MG PO TABS
50.0000 mg | ORAL_TABLET | Freq: Three times a day (TID) | ORAL | Status: DC
  Administered 2022-04-16 – 2022-04-17 (×2): 50 mg via ORAL
  Filled 2022-04-16 (×2): qty 1

## 2022-04-16 NOTE — Progress Notes (Signed)
PROGRESS NOTE    Raymond Cox  [id number] DOB: 1961-02-20 DOA: 04/15/2022 PCP: [facility]   Brief Narrative:   Raymond Cox is a 61 y.o. male with medical history significant of HTN, CKD3a, seizure d/o. Presenting with fevers and chills. His wife reports that he started having fevers about a week ago. They ranged from 99 - 103. He has had accompanying chills; but no nausea, vomiting, diarrhea, cough or respiratory distress. He has not had any sick contacts. He has had urinary hesitancy but no burning or change in urine appearance. He has taken APAP which has provided temporary relief. When his symptoms did not improve last night, he decided to come to the ED for assistance.   Assessment & Plan:   Principal Problem:   Sepsis ([place]) Active Problems:   Hypertension   Seizure ([place] )   CKD (chronic kidney disease), stage III (H[facility])   UTI (urinary tract infection)   Elevated LFTs   Urinary hesitancy  UTI with Sepsis      - c/w abx  vanc/cefepime, but anticipate deescalation soon     - follow bld cx, Ucx     - CXR is negative     - lactic acid is normal     - fluid resus   CKD3a Urinary Hesitancy     - gets care through [place], so no recent values to compare to; but as off 2020 his Scr ranged from 1.5 to 1.7 and he was CKD3a at the time; today he is 1.85     - watch nephrotoxins     - reviewed renal US-medical renal dz     - fluids as above   Elevated LFTs     - neg hepatitis panel     - hepatocellular pattern; follow     - follow cmp   Seizure disorder     - restarted home regimen   HTN     - continue home regimen     - will have PRNs available  DVT prophylaxis: SCD/Compression stockings  Code Status: full    Code Status Orders  (From admission, onward)           Start     Ordered   04/15/22 1139  Full code  Continuous        04/15/22 1145           Code Status History     Date Active Date Inactive Code Status Order ID Comments User Context    04/27/2019 1210 05/03/2019 1851 Full Code [id number]  Norval Morton, MD ED   04/27/2019 0227 04/27/2019 1210 Full Code [id number]  Delora Fuel, MD ED   04/26/2019 0140 04/26/2019 1852 Full Code [id number]  Roney Jaffe, MD Inpatient   08/25/2018 0053 08/25/2018 2029 Full Code [id number]  Ivor Costa, MD ED      Family Communication: discussed with wife at bedside  Disposition Plan:   will continue inp[t for iv abx Consults called: None Admission status: Inpatient   Consultants:  None  Procedures:  US Abdomen Complete  Result Date: 04/15/2022 CLINICAL DATA:  Acute kidney injury and elevated liver function tests. EXAM: ABDOMEN ULTRASOUND COMPLETE COMPARISON:  None Available. FINDINGS: Gallbladder: No visualized. No sonographic Murphy sign noted by sonographer. Borderline gallbladder wall thickening at 0.3 cm; the gallbladder small in caliber and nondistended (resembling contraction) despite the patient's last meal being 15 hours prior to imaging. Small amount of sludge in the gallbladder. Common bile duct: Diameter: 0.3 cm  Liver: No focal lesion identified. Within normal limits in parenchymal echogenicity. Portal vein is patent on color Doppler imaging with normal direction of blood flow towards the liver. IVC: No abnormality visualized. Pancreas: Visualized portion unremarkable. Pancreatic tail not seen due to overlying bowel gas. Spleen: Size and appearance within normal limits. Right Kidney: Length: 8.3 cm. Mildly accentuated echogenicity. No mass or hydronephrosis visualized. Left Kidney: Length: 9.8 cm. Mildly accentuated echogenicity. No mass or hydronephrosis visualized. Abdominal aorta: No aneurysm visualized. Other findings: Reduced sensitivity due to motion artifact and bowel gas. IMPRESSION: 1. Mildly accentuated echogenicity of the kidneys bilaterally, potentially from chronic medical renal disease. 2. Although the patient's last meal was 15 hours prior to imaging, there borderline gallbladder  wall thickening and a small contracted appearance of the gallbladder. Sonographic Murphy's sign absent, no gallstones identified. Small amount of sludge noted in the gallbladder. If there is concern for gallbladder dysfunction, nuclear medicine hepatobiliary scan with gallbladder ejection fraction calculation is an option for further assessment. 3. Pancreatic tail not well seen due to overlying bowel gas. Electronically Signed   By: Van Clines M.D.   On: 04/15/2022 12:45   DG Chest Port 1 View  Result Date: 04/15/2022 CLINICAL DATA:  Questionable sepsis.  Fever EXAM: PORTABLE CHEST 1 VIEW COMPARISON:  08/24/2018 FINDINGS: Artifact from EKG leads. Normal heart size and mediastinal contours. No acute infiltrate or edema. No effusion or pneumothorax. No acute osseous findings. IMPRESSION: Negative portable chest. Electronically Signed   By: Jorje Guild M.D.   On: 04/15/2022 04:34    Antimicrobials:  Vanc, cfpm    Subjective: Reports feeling mod better than earlier admission  Objective: Vitals:   04/16/22 0006 04/16/22 0403 04/16/22 1100 04/16/22 1312  BP: (!) 152/63 133/63  130/75  Pulse: 69 61  60  Resp: 18 18  19   Temp: (!) 100.4 F (38 C) 98.6 F (37 C) 99.1 F (37.3 C) 98.3 F (36.8 C)  TempSrc: Oral Oral  Oral  SpO2: 97% 96%  99%  Weight:      Height:        Intake/Output Summary (Last 24 hours) at 04/16/2022 1549 Last data filed at 04/16/2022 1313 Gross per 24 hour  Intake 2077.2 ml  Output 650 ml  Net 1427.2 ml   Filed Weights   04/15/22 0400 04/15/22 1031  Weight: 81.6 kg 88 kg    Examination:  General exam: Appears calm and comfortable  Respiratory system: Clear to auscultation. Respiratory effort normal. Cardiovascular system: S1 & S2 heard, RRR. No JVD, murmurs, rubs, gallops or clicks. No pedal edema. Gastrointestinal system: Abdomen is nondistended, soft and nontender. No organomegaly or masses felt. Normal bowel sounds heard. Central nervous  system: Alert and oriented. No focal neurological deficits. Extremities: Symmetric 5 x 5 power. Skin: No rashes, lesions or ulcers Psychiatry: Judgement and insight appear normal. Mood & affect appropriate.     Data Reviewed: I have personally reviewed following labs and imaging studies  CBC: Recent Labs  Lab 04/15/22 0412 04/16/22 0602  WBC 5.9 9.7  NEUTROABS 5.0  --   HGB 13.4 12.5*  HCT 40.2 38.1*  MCV 93.7 98.2  PLT 151 [id number]*   Basic Metabolic Panel: Recent Labs  Lab 04/15/22 0412 04/16/22 0602  NA 137 140  K 3.7 3.8  CL 104 108  CO2 21* 23  GLUCOSE 118* 92  BUN 23 26*  CREATININE 1.86* 1.85*  CALCIUM 8.9 8.4*   GFR: Estimated Creatinine Clearance: 47.5 mL/min (A) (by  C-G formula based on SCr of 1.85 mg/dL (H)). Liver Function Tests: Recent Labs  Lab 04/15/22 0412 04/16/22 0602  AST 122* 146*  ALT 226* 245*  ALKPHOS 112 126  BILITOT 0.4 0.8  PROT 7.0 6.0*  ALBUMIN 3.8 2.8*   No results for input(s): LIPASE, AMYLASE in the last 168 hours. No results for input(s): AMMONIA in the last 168 hours. Coagulation Profile: Recent Labs  Lab 04/15/22 0412 04/16/22 0602  INR 1.0 1.2   Cardiac Enzymes: No results for input(s): CKTOTAL, CKMB, CKMBINDEX, TROPONINI in the last 168 hours. BNP (last 3 results) No results for input(s): PROBNP in the last 8760 hours. HbA1C: No results for input(s): HGBA1C in the last 72 hours. CBG: No results for input(s): GLUCAP in the last 168 hours. Lipid Profile: No results for input(s): CHOL, HDL, LDLCALC, TRIG, CHOLHDL, LDLDIRECT in the last 72 hours. Thyroid Function Tests: No results for input(s): TSH, T4TOTAL, FREET4, T3FREE, THYROIDAB in the last 72 hours. Anemia Panel: No results for input(s): VITAMINB12, FOLATE, FERRITIN, TIBC, IRON, RETICCTPCT in the last 72 hours. Sepsis Labs: Recent Labs  Lab 04/15/22 0412 04/16/22 0602  PROCALCITON  --  87.25  LATICACIDVEN 1.5  --     Recent Results (from the past 240  hour(s))  Resp Panel by RT-PCR (Flu A&B, Covid) Anterior Nasal Swab     Status: None   Collection Time: 04/15/22  4:12 AM   Specimen: Anterior Nasal Swab  Result Value Ref Range Status   SARS Coronavirus 2 by RT PCR NEGATIVE NEGATIVE Final    Comment: (NOTE) SARS-CoV-2 target nucleic acids are NOT DETECTED.  The SARS-CoV-2 RNA is generally detectable in upper respiratory specimens during the acute phase of infection. The lowest concentration of SARS-CoV-2 viral copies this assay can detect is 138 copies/mL. A negative result does not preclude SARS-Cov-2 infection and should not be used as the sole basis for treatment or other patient management decisions. A negative result may occur with  improper specimen collection/handling, submission of specimen other than nasopharyngeal swab, presence of viral mutation(s) within the areas targeted by this assay, and inadequate number of viral copies(<138 copies/mL). A negative result must be combined with clinical observations, patient history, and epidemiological information. The expected result is Negative.  Fact Sheet for Patients:  EntrepreneurPulse.com.au  Fact Sheet for Healthcare Providers:  IncredibleEmployment.be  This test is no t yet approved or cleared by the [place] FDA and  has been authorized for detection and/or diagnosis of SARS-CoV-2 by FDA under an Emergency Use Authorization (EUA). This EUA will remain  in effect (meaning this test can be used) for the duration of the COVID-19 declaration under Section 564(b)(1) of the Act, 21 U.S.C.section 360bbb-3(b)(1), unless the authorization is terminated  or revoked sooner.       Influenza A by PCR NEGATIVE NEGATIVE Final   Influenza B by PCR NEGATIVE NEGATIVE Final    Comment: (NOTE) The Xpert Xpress SARS-CoV-2/FLU/RSV plus assay is intended as an aid in the diagnosis of influenza from Nasopharyngeal swab specimens and should not be  used as a sole basis for treatment. Nasal washings and aspirates are unacceptable for Xpert Xpress SARS-CoV-2/FLU/RSV testing.  Fact Sheet for Patients: EntrepreneurPulse.com.au  Fact Sheet for Healthcare Providers: IncredibleEmployment.be  This test is not yet approved or cleared by the [place] FDA and has been authorized for detection and/or diagnosis of SARS-CoV-2 by FDA under an Emergency Use Authorization (EUA). This EUA will remain in effect (meaning this test can be  used) for the duration of the COVID-19 declaration under Section 564(b)(1) of the Act, 21 U.S.C. section 360bbb-3(b)(1), unless the authorization is terminated or revoked.  Performed at [facility], [address] [place], [place] [address]   Blood Culture (routine x 2)     Status: None (Preliminary result)   Collection Time: 04/15/22  4:12 AM   Specimen: BLOOD  Result Value Ref Range Status   Specimen Description   Final    BLOOD Performed at [facility], [address], [place], [place] [address]    Special Requests   Final    NONE Performed at [facility], [address] [place], [place] [address]    Culture   Final    NO GROWTH < 24 HOURS Performed at [facility], [place] [address], [place], [place] [address]    Report Status PENDING  Incomplete  Blood Culture (routine x 2)     Status: None (Preliminary result)   Collection Time: 04/15/22  4:12 AM   Specimen: BLOOD  Result Value Ref Range Status   Specimen Description   Final    BLOOD Performed at [facility], [address], [place], [place] [address]    Special Requests   Final    NONE Performed at [facility], [address] [place], [place] [address]    Culture   Final    NO GROWTH < 24 HOURS Performed at [facility], [place] [address] [place], [place] [address]    Report Status  PENDING  Incomplete  Urine Culture     Status: Abnormal (Preliminary result)   Collection Time: 04/15/22  4:12 AM   Specimen: In/Out Cath Urine  Result Value Ref Range Status   Specimen Description   Final    IN/OUT CATH URINE Performed at [facility], [address] [place],  [address]    Special Requests   Final    NONE Performed at [facility], [place], [place] [address]    Culture 1,000 COLONIES/mL ESCHERICHIA COLI (A)  Final   Report Status PENDING  Incomplete         Radiology Studies: US Abdomen Complete  Result Date: 04/15/2022 CLINICAL DATA:  Acute kidney injury and elevated liver function tests. EXAM: ABDOMEN ULTRASOUND COMPLETE COMPARISON:  None Available. FINDINGS: Gallbladder: No visualized. No sonographic Murphy sign noted by sonographer. Borderline gallbladder wall thickening at 0.3 cm; the gallbladder small in caliber and nondistended (resembling contraction) despite the patient's last meal being 15 hours prior to imaging. Small amount of sludge in the gallbladder. Common bile duct: Diameter: 0.3 cm Liver: No focal lesion identified. Within normal limits in parenchymal echogenicity. Portal vein is patent on color Doppler imaging with normal direction of blood flow towards the liver. IVC: No abnormality visualized. Pancreas: Visualized portion unremarkable. Pancreatic tail not seen due to overlying bowel gas. Spleen: Size and appearance within normal limits. Right Kidney: Length: 8.3 cm. Mildly accentuated echogenicity. No mass or hydronephrosis visualized. Left Kidney: Length: 9.8 cm. Mildly accentuated echogenicity. No mass or hydronephrosis visualized. Abdominal aorta: No aneurysm visualized. Other findings: Reduced sensitivity due to motion artifact and bowel gas. IMPRESSION: 1. Mildly accentuated echogenicity of the kidneys bilaterally, potentially from chronic medical renal disease. 2. Although the  patient's last meal was 15 hours prior to imaging, there borderline gallbladder wall thickening and a small contracted appearance of the gallbladder. Sonographic Murphy's sign absent, no gallstones identified. Small amount of  sludge noted in the gallbladder. If there is concern for gallbladder dysfunction, nuclear medicine hepatobiliary scan with gallbladder ejection fraction calculation is an option for further assessment. 3. Pancreatic tail not well seen due to overlying bowel gas. Electronically Signed   By: Van Clines M.D.   On: 04/15/2022 12:45   DG Chest Port 1 View  Result Date: 04/15/2022 CLINICAL DATA:  Questionable sepsis.  Fever EXAM: PORTABLE CHEST 1 VIEW COMPARISON:  08/24/2018 FINDINGS: Artifact from EKG leads. Normal heart size and mediastinal contours. No acute infiltrate or edema. No effusion or pneumothorax. No acute osseous findings. IMPRESSION: Negative portable chest. Electronically Signed   By: Jorje Guild M.D.   On: 04/15/2022 04:34        Scheduled Meds:  levETIRAcetam  1,000 mg Oral BID   Continuous Infusions:  ceFEPime (MAXIPIME) IV 2 g (04/16/22 1516)   vancomycin 1,250 mg (04/16/22 0512)     LOS: 1 day    Time spent: 35 min    Nicolette Bang, MD [facility]  If 7PM-7AM, please contact night-coverage  04/16/2022, 3:49 PM

## 2022-04-16 NOTE — Plan of Care (Signed)

## 2022-04-17 DIAGNOSIS — I10 Essential (primary) hypertension: Secondary | ICD-10-CM | POA: Diagnosis not present

## 2022-04-17 DIAGNOSIS — A419 Sepsis, unspecified organism: Secondary | ICD-10-CM | POA: Diagnosis not present

## 2022-04-17 DIAGNOSIS — N1831 Chronic kidney disease, stage 3a: Secondary | ICD-10-CM | POA: Diagnosis not present

## 2022-04-17 DIAGNOSIS — R7989 Other specified abnormal findings of blood chemistry: Secondary | ICD-10-CM | POA: Diagnosis not present

## 2022-04-17 LAB — CBC WITH DIFFERENTIAL/PLATELET
Abs Immature Granulocytes: 0.14 10*3/uL — ABNORMAL HIGH (ref 0.00–0.07)
Basophils Absolute: 0 10*3/uL (ref 0.0–0.1)
Basophils Relative: 0 %
Eosinophils Absolute: 0.1 10*3/uL (ref 0.0–0.5)
Eosinophils Relative: 1 %
HCT: 38.6 % — ABNORMAL LOW (ref 39.0–52.0)
Hemoglobin: 12.8 g/dL — ABNORMAL LOW (ref 13.0–17.0)
Immature Granulocytes: 1 %
Lymphocytes Relative: 16 %
Lymphs Abs: 1.7 10*3/uL (ref 0.7–4.0)
MCH: 32.7 pg (ref 26.0–34.0)
MCHC: 33.2 g/dL (ref 30.0–36.0)
MCV: 98.5 fL (ref 80.0–100.0)
Monocytes Absolute: 2.4 10*3/uL — ABNORMAL HIGH (ref 0.1–1.0)
Monocytes Relative: 23 %
Neutro Abs: 6.2 10*3/uL (ref 1.7–7.7)
Neutrophils Relative %: 59 %
Platelets: 143 10*3/uL — ABNORMAL LOW (ref 150–400)
RBC: 3.92 MIL/uL — ABNORMAL LOW (ref 4.22–5.81)
RDW: 13.4 % (ref 11.5–15.5)
WBC: 10.6 10*3/uL — ABNORMAL HIGH (ref 4.0–10.5)
nRBC: 0 % (ref 0.0–0.2)

## 2022-04-17 LAB — COMPREHENSIVE METABOLIC PANEL
ALT: 178 U/L — ABNORMAL HIGH (ref 0–44)
AST: 69 U/L — ABNORMAL HIGH (ref 15–41)
Albumin: 2.8 g/dL — ABNORMAL LOW (ref 3.5–5.0)
Alkaline Phosphatase: 113 U/L (ref 38–126)
Anion gap: 8 (ref 5–15)
BUN: 25 mg/dL — ABNORMAL HIGH (ref 8–23)
CO2: 23 mmol/L (ref 22–32)
Calcium: 8.2 mg/dL — ABNORMAL LOW (ref 8.9–10.3)
Chloride: 107 mmol/L (ref 98–111)
Creatinine, Ser: 1.46 mg/dL — ABNORMAL HIGH (ref 0.61–1.24)
GFR, Estimated: 54 mL/min — ABNORMAL LOW (ref >60)
Glucose, Bld: 107 mg/dL — ABNORMAL HIGH (ref 70–99)
Potassium: 3.6 mmol/L (ref 3.5–5.1)
Sodium: 138 mmol/L (ref 135–145)
Total Bilirubin: 0.7 mg/dL (ref 0.3–1.2)
Total Protein: 6.2 g/dL — ABNORMAL LOW (ref 6.5–8.1)

## 2022-04-17 LAB — URINE CULTURE: Culture: 1000 — AB

## 2022-04-17 MED ORDER — SODIUM CHLORIDE 0.9 % IV SOLN: 2.0000 g | Freq: Three times a day (TID) | INTRAVENOUS | Status: DC

## 2022-04-17 MED ORDER — NITROFURANTOIN MONOHYD MACRO 100 MG PO CAPS: 100.0000 mg | ORAL_CAPSULE | Freq: Two times a day (BID) | ORAL | 0 refills | Status: AC

## 2022-04-17 MED ORDER — VANCOMYCIN HCL 1500 MG/300ML IV SOLN: 1500.0000 mg | INTRAVENOUS | Status: DC

## 2022-04-17 NOTE — Progress Notes (Signed)
Pharmacy Antibiotic Note  Raymond Cox is a 61 y.o. male admitted on 04/15/2022 with UTI and possible alternative source of infection as well.  Pharmacy has been consulted for Cefepime and Vancomycin dosing.  Plan: Increase Cefepime 2g IV q8h Increase Vancomycin 1500mg  IV q24h.   (SCr 1.46, est AUC 435) Measure Vanc levels as needed; Goal AUC = 400 - 550. Follow up renal function, culture results, and clinical course - per notes, MD plans to de-escalate   Height: 6\' 2"  (188 cm) Weight: 88 kg (194 lb 0.1 oz) IBW/kg (Calculated) : 82.2  Temp (24hrs), Avg:98.4 F (36.9 C), Min:98.2 F (36.8 C), Max:99.1 F (37.3 C)  Recent Labs  Lab 04/15/22 0412 04/16/22 0602 04/17/22 0550  WBC 5.9 9.7 10.6*  CREATININE 1.86* 1.85* 1.46*  LATICACIDVEN 1.5  --   --      Estimated Creatinine Clearance: 60.2 mL/min (A) (by C-G formula based on SCr of 1.46 mg/dL (H)).    No Known Allergies  Antimicrobials this admission: 5/26 Cefepime 5/26 Metronidazole x1 5/26 Vancomycin   Dose adjustments this admission:   Microbiology results: 5/26 BCx: ngtd 5/26 UCx:  1k E.coli 5/26 COVID/flu: negative  Thank you for allowing pharmacy to be a part of this patient's care.  6/26, PharmD, BCPS [facility] main pharmacy [phone] 04/17/2022 10:51 AM

## 2022-04-17 NOTE — Discharge Summary (Signed)
Physician Discharge Summary  Raymond Cox [id number] DOB: [birth year] DOA: 04/15/2022  PCP: [facility]  Admit date: 04/15/2022 Discharge date: 04/17/2022  Admitted From: Inpatient Disposition: home  Recommendations for Outpatient Follow-up:  Follow up with PCP in 1-2 weeks   Home Health:No Equipment/Devices:none  Discharge Condition:Good CODE STATUS:Full code Diet recommendation: Regular healthy diet  Brief/Interim Summary:  Raymond Cox is a 61 y.o. male with medical history significant of HTN, CKD3a, seizure d/o. Presenting with fevers and chills. His wife reports that he started having fevers about a week ago. They ranged from 99 - 103. He has had accompanying chills; but no nausea, vomiting, diarrhea, cough or respiratory distress. He has not had any sick contacts. He has had urinary hesitancy but no burning or change in urine appearance. He has taken APAP which has provided temporary relief. When his symptoms did not improve last night, he decided to come to the ED for assistance   Patient was diagnosed with sepsis with urinary tract infection.  He was initially placed on broad-spectrum antibiotics with blood cultures and urine cultures obtained.  Blood cultures have been negative urine culture was positive for E. coli which was pansensitive.  Patient has been afebrile, normotensive and near baseline.  He received fluids and is stable.  He will be discharged home to complete a course of nitrofurantoin.  Patient requested for 2 prescriptions 1 was sent to the [facility] which patient will pick up on Tuesday and he was given 2 days of nitrofurantoin that he will get through [facility] until he goes to the [place] to pick up the remainder of prescription.  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  He was noted of elevated LFTs and a negative hepatitis panel and LFTs continues to decline.  Will defer  further follow-up to his outpatient provider.  He will continue his home medications for his seizure disorder and his hypertension.  Discharge Diagnoses:  Principal Problem:   Sepsis ([place]) Active Problems:   Hypertension   Seizure ([place])   CKD (chronic kidney disease), stage III (HCC)   UTI (urinary tract infection)   Elevated LFTs   Urinary hesitancy    Discharge Instructions  Discharge Instructions     Call MD for:  extreme fatigue   Complete by: As directed    Call MD for:  persistant dizziness or light-headedness   Complete by: As directed    Call MD for:  persistant nausea and vomiting   Complete by: As directed    Call MD for:  severe uncontrolled pain   Complete by: As directed    Call MD for:  temperature >100.4   Complete by: As directed    Diet - low sodium heart healthy   Complete by: As directed    Discharge instructions   Complete by: As directed    Return to Er for any acute change in medical condition   Increase activity slowly   Complete by: As directed       Allergies as of 04/17/2022   No Known Allergies      Medication List     STOP taking these medications    amoxicillin-clavulanate 875-125 MG tablet Commonly known as: AUGMENTIN   penicillin v potassium 500 MG tablet Commonly known as: VEETID       TAKE these medications    acetaminophen 500 MG tablet Commonly known as: TYLENOL Take  500 mg by mouth daily as needed for fever or headache (pain).   aspirin 81 MG chewable tablet Chew 81 mg by mouth daily.   Capsaicin 0.1 % Crea Apply 1 application. topically 2 (two) times daily as needed (pain).   diltiazem 240 MG 24 hr capsule Commonly known as: DILACOR XR Take 240 mg by mouth daily. When able to remember   divalproex 500 MG DR tablet Commonly known as: DEPAKOTE Take 2 tablets (1,000 mg total) by mouth every 12 (twelve) hours. What changed:  when to take this additional instructions   feeding supplement (GLUCERNA SHAKE)  Liqd Take 1 Container by mouth See admin instructions. Drink 1 container 1-3 times a day   hydrALAZINE 50 MG tablet Commonly known as: APRESOLINE Take 50 mg by mouth in the morning and at bedtime. When able to remember What changed: Another medication with the same name was removed. Continue taking this medication, and follow the directions you see here.   levETIRAcetam 1000 MG tablet Commonly known as: KEPPRA Take 1 tablet (1,000 mg total) by mouth 2 (two) times daily. What changed:  when to take this additional instructions   MUCINEX NIGHTSHIFT SINUS PO Take 10 mLs by mouth daily as needed (fever/cough).   nitrofurantoin (macrocrystal-monohydrate) 100 MG capsule Commonly known as: Macrobid Take 1 capsule (100 mg total) by mouth 2 (two) times daily for 7 days.   nitrofurantoin (macrocrystal-monohydrate) 100 MG capsule Commonly known as: Macrobid Take 1 capsule (100 mg total) by mouth 2 (two) times daily for 2 days.   oxcarbazepine 600 MG tablet Commonly known as: TRILEPTAL Take 1 tablet (600 mg total) by mouth 2 (two) times daily. What changed:  when to take this additional instructions   sildenafil 100 MG tablet Commonly known as: VIAGRA Take 50 mg by mouth See admin instructions. Take 50 mg by mouth 1 hour prior to sexual activity. Do not exceed 1 dose per 24 hours   SUMAtriptan 50 MG tablet Commonly known as: IMITREX Take 50 mg by mouth See admin instructions. Take 50 mg by mouth at the onset of headache, then repeat in 2 hours if headache persists or recurs. No more than 200 mg a day   VITAMIN D3 PO Take 1 capsule by mouth daily.        Follow-up Information     [facility] Follow up.   Specialty: Home Health Services Why: [facility] nursing,aide Contact information: [place] [id number] [phone]                No Known Allergies  Consultations: None   Procedures/Studies: US Abdomen Complete  Result  Date: 04/15/2022 CLINICAL DATA:  Acute kidney injury and elevated liver function tests. EXAM: ABDOMEN ULTRASOUND COMPLETE COMPARISON:  None Available. FINDINGS: Gallbladder: No visualized. No sonographic Murphy sign noted by sonographer. Borderline gallbladder wall thickening at 0.3 cm; the gallbladder small in caliber and nondistended (resembling contraction) despite the patient's last meal being 15 hours prior to imaging. Small amount of sludge in the gallbladder. Common bile duct: Diameter: 0.3 cm Liver: No focal lesion identified. Within normal limits in parenchymal echogenicity. Portal vein is patent on color Doppler imaging with normal direction of blood flow towards the liver. IVC: No abnormality visualized. Pancreas: Visualized portion unremarkable. Pancreatic tail not seen due to overlying bowel gas. Spleen: Size and appearance within normal limits. Right Kidney: Length: 8.3 cm. Mildly accentuated echogenicity. No mass or hydronephrosis visualized. Left Kidney: Length: 9.8 cm. Mildly  accentuated echogenicity. No mass or hydronephrosis visualized. Abdominal aorta: No aneurysm visualized. Other findings: Reduced sensitivity due to motion artifact and bowel gas. IMPRESSION: 1. Mildly accentuated echogenicity of the kidneys bilaterally, potentially from chronic medical renal disease. 2. Although the patient's last meal was 15 hours prior to imaging, there borderline gallbladder wall thickening and a small contracted appearance of the gallbladder. Sonographic Murphy's sign absent, no gallstones identified. Small amount of sludge noted in the gallbladder. If there is concern for gallbladder dysfunction, nuclear medicine hepatobiliary scan with gallbladder ejection fraction calculation is an option for further assessment. 3. Pancreatic tail not well seen due to overlying bowel gas. Electronically Signed   By: Van Clines M.D.   On: 04/15/2022 12:45   DG Chest Port 1 View  Result Date:  04/15/2022 CLINICAL DATA:  Questionable sepsis.  Fever EXAM: PORTABLE CHEST 1 VIEW COMPARISON:  08/24/2018 FINDINGS: Artifact from EKG leads. Normal heart size and mediastinal contours. No acute infiltrate or edema. No effusion or pneumothorax. No acute osseous findings. IMPRESSION: Negative portable chest. Electronically Signed   By: Jorje Guild M.D.   On: 04/15/2022 04:34      Subjective: Feels at baseline  Discharge Exam: Vitals:   04/16/22 2223 04/17/22 0514  BP: (!) 143/75 133/76  Pulse: 65 (!) 57  Resp: 16 16  Temp: 98.2 F (36.8 C) 98.2 F (36.8 C)  SpO2: 96% 99%   Vitals:   04/16/22 1312 04/16/22 2044 04/16/22 2223 04/17/22 0514  BP: 130/75  (!) 143/75 133/76  Pulse: 60  65 (!) 57  Resp: 19  16 16   Temp: 98.3 F (36.8 C) 98.2 F (36.8 C) 98.2 F (36.8 C) 98.2 F (36.8 C)  TempSrc: Oral Oral Oral Oral  SpO2: 99%  96% 99%  Weight:      Height:        General: Pt is alert, awake, not in acute distress Cardiovascular: RRR, S1/S2 +, no rubs, no gallops Respiratory: CTA bilaterally, no wheezing, no rhonchi Abdominal: Soft, NT, ND, bowel sounds + Extremities: no edema, no cyanosis    The results of significant diagnostics from this hospitalization (including imaging, microbiology, ancillary and laboratory) are listed below for reference.     Microbiology: Recent Results (from the past 240 hour(s))  Resp Panel by RT-PCR (Flu A&B, Covid) Anterior Nasal Swab     Status: None   Collection Time: 04/15/22  4:12 AM   Specimen: Anterior Nasal Swab  Result Value Ref Range Status   SARS Coronavirus 2 by RT PCR NEGATIVE NEGATIVE Final    Comment: (NOTE) SARS-CoV-2 target nucleic acids are NOT DETECTED.  The SARS-CoV-2 RNA is generally detectable in upper respiratory specimens during the acute phase of infection. The lowest concentration of SARS-CoV-2 viral copies this assay can detect is 138 copies/mL. A negative result does not preclude SARS-Cov-2 infection and  should not be used as the sole basis for treatment or other patient management decisions. A negative result may occur with  improper specimen collection/handling, submission of specimen other than nasopharyngeal swab, presence of viral mutation(s) within the areas targeted by this assay, and inadequate number of viral copies(<138 copies/mL). A negative result must be combined with clinical observations, patient history, and epidemiological information. The expected result is Negative.  Fact Sheet for Patients:  EntrepreneurPulse.com.au  Fact Sheet for Healthcare Providers:  IncredibleEmployment.be  This test is no t yet approved or cleared by the [place] FDA and  has been authorized for detection and/or diagnosis of SARS-CoV-2  by FDA under an Emergency Use Authorization (EUA). This EUA will remain  in effect (meaning this test can be used) for the duration of the COVID-19 declaration under Section 564(b)(1) of the Act, 21 U.S.C.section 360bbb-3(b)(1), unless the authorization is terminated  or revoked sooner.       Influenza A by PCR NEGATIVE NEGATIVE Final   Influenza B by PCR NEGATIVE NEGATIVE Final    Comment: (NOTE) The Xpert Xpress SARS-CoV-2/FLU/RSV plus assay is intended as an aid in the diagnosis of influenza from Nasopharyngeal swab specimens and should not be used as a sole basis for treatment. Nasal washings and aspirates are unacceptable for Xpert Xpress SARS-CoV-2/FLU/RSV testing.  Fact Sheet for Patients: EntrepreneurPulse.com.au  Fact Sheet for Healthcare Providers: IncredibleEmployment.be  This test is not yet approved or cleared by the [place] FDA and has been authorized for detection and/or diagnosis of SARS-CoV-2 by FDA under an Emergency Use Authorization (EUA). This EUA will remain in effect (meaning this test can be used) for the duration of the COVID-19 declaration  under Section 564(b)(1) of the Act, 21 U.S.C. section 360bbb-3(b)(1), unless the authorization is terminated or revoked.  Performed at [facility], [address] [place], [place] [address]   Blood Culture (routine x 2)     Status: None (Preliminary result)   Collection Time: 04/15/22  4:12 AM   Specimen: BLOOD  Result Value Ref Range Status   Specimen Description   Final    BLOOD Performed at [facility], [address] [place], [place] [address]    Special Requests   Final    NONE Performed at [facility], [address], [place], [place] [address]    Culture   Final    NO GROWTH 2 DAYS Performed at [facility], [place] [address], [place], [place] [address]    Report Status PENDING  Incomplete  Blood Culture (routine x 2)     Status: None (Preliminary result)   Collection Time: 04/15/22  4:12 AM   Specimen: BLOOD  Result Value Ref Range Status   Specimen Description   Final    BLOOD Performed at [facility], [address], [place], [place] [address]    Special Requests   Final    NONE Performed at [facility], [address], [place], [place] [address]    Culture   Final    NO GROWTH 2 DAYS Performed at [facility], [place] [address], [place], [place] [address]    Report Status PENDING  Incomplete  Urine Culture     Status: Abnormal   Collection Time: 04/15/22  4:12 AM   Specimen: In/Out Cath Urine  Result Value Ref Range Status   Specimen Description   Final    IN/OUT CATH URINE Performed at [facility], [address], [place], [place] [address]    Special Requests   Final    NONE Performed at [facility], [place], [place] [address]    Culture 1,000 COLONIES/mL ESCHERICHIA COLI (A)  Final   Report Status 04/17/2022 FINAL  Final   Organism ID, Bacteria ESCHERICHIA COLI (A)  Final       Susceptibility   Escherichia coli - MIC*    AMPICILLIN <=2 SENSITIVE Sensitive     CEFAZOLIN <=4 SENSITIVE Sensitive     CEFEPIME <=0.12 SENSITIVE Sensitive     CEFTRIAXONE <=0.25 SENSITIVE Sensitive     CIPROFLOXACIN <=0.25 SENSITIVE Sensitive  GENTAMICIN <=1 SENSITIVE Sensitive     IMIPENEM <=0.25 SENSITIVE Sensitive     NITROFURANTOIN <=16 SENSITIVE Sensitive     TRIMETH/SULFA <=20 SENSITIVE Sensitive     AMPICILLIN/SULBACTAM <=2 SENSITIVE Sensitive     PIP/TAZO <=4 SENSITIVE Sensitive     * 1,000 COLONIES/mL ESCHERICHIA COLI     Labs: BNP (last 3 results) No results for input(s): BNP in the last 8760 hours. Basic Metabolic Panel: Recent Labs  Lab 04/15/22 0412 04/16/22 0602 04/17/22 0550  NA 137 140 138  K 3.7 3.8 3.6  CL 104 108 107  CO2 21* 23 23  GLUCOSE 118* 92 107*  BUN 23 26* 25*  CREATININE 1.86* 1.85* 1.46*  CALCIUM 8.9 8.4* 8.2*   Liver Function Tests: Recent Labs  Lab 04/15/22 0412 04/16/22 0602 04/17/22 0550  AST 122* 146* 69*  ALT 226* 245* 178*  ALKPHOS 112 126 113  BILITOT 0.4 0.8 0.7  PROT 7.0 6.0* 6.2*  ALBUMIN 3.8 2.8* 2.8*   No results for input(s): LIPASE, AMYLASE in the last 168 hours. No results for input(s): AMMONIA in the last 168 hours. CBC: Recent Labs  Lab 04/15/22 0412 04/16/22 0602 04/17/22 0550  WBC 5.9 9.7 10.6*  NEUTROABS 5.0  --  6.2  HGB 13.4 12.5* 12.8*  HCT 40.2 38.1* 38.6*  MCV 93.7 98.2 98.5  PLT 151 130* 143*   Cardiac Enzymes: No results for input(s): CKTOTAL, CKMB, CKMBINDEX, TROPONINI in the last 168 hours. BNP: Invalid input(s): POCBNP CBG: No results for input(s): GLUCAP in the last 168 hours. D-Dimer No results for input(s): DDIMER in the last 72 hours. Hgb A1c No results for input(s): HGBA1C in the last 72 hours. Lipid Profile No results for input(s): CHOL, HDL, LDLCALC, TRIG, CHOLHDL, LDLDIRECT in the last 72 hours. Thyroid function studies No results for input(s): TSH, T4TOTAL, T3FREE,  THYROIDAB in the last 72 hours.  Invalid input(s): FREET3 Anemia work up No results for input(s): VITAMINB12, FOLATE, FERRITIN, TIBC, IRON, RETICCTPCT in the last 72 hours. Urinalysis    Component Value Date/Time   COLORURINE YELLOW 04/15/2022 0412   APPEARANCEUR HAZY (A) 04/15/2022 0412   LABSPEC 1.019 04/15/2022 0412   PHURINE 7.5 04/15/2022 0412   GLUCOSEU NEGATIVE 04/15/2022 0412   HGBUR MODERATE (A) 04/15/2022 0412   BILIRUBINUR NEGATIVE 04/15/2022 0412   KETONESUR TRACE (A) 04/15/2022 0412   PROTEINUR 100 (A) 04/15/2022 0412   NITRITE NEGATIVE 04/15/2022 0412   LEUKOCYTESUR LARGE (A) 04/15/2022 0412   Sepsis Labs Invalid input(s): PROCALCITONIN,  WBC,  LACTICIDVEN Microbiology KEIFER HAUTER [id number] DOB: October 28, 1961 DOA: 04/15/2022   [facility]P: [facility]   Admit date: 04/15/2022 Discharge date: 04/17/2022   Admitted From: Inpatient Disposition: home   Recommendations for Outpatient Follow-up:  Follow up with PCP in 1-2 weeks     Home Health:No Equipment/Devices:no new equipment  Discharge Condition:Good CODE STATUS:Full code Diet recommendation: Regular healthy diet   Brief/Interim Summary:  Raymond Cox is a 61 y.o. male with medical history significant of HTN, CKD3a, seizure d/o. Presenting with fevers and chills. His wife reports that he started having fevers about a week ago. They ranged from 99 - 103. He has had accompanying chills; but no nausea, vomiting, diarrhea, cough or respiratory distress. He has not had any sick contacts. He has had urinary hesitancy but no burning or change in urine appearance. He has taken APAP which has provided temporary relief. When his symptoms did not improve last night, he decided to come  to the ED for assistance   Patient was diagnosed with sepsis with urinary tract infection.  He was initially placed on broad-spectrum antibiotics with blood cultures and urine cultures obtained.  Blood cultures have been negative  urine culture was positive for E. coli which was pansensitive.  Patient has been afebrile, normotensive and near baseline.  He received fluids and is stable.  He will be discharged home to complete a course of nitrofurantoin.  Patient requested for 2 prescriptions 1 was sent to the [facility] which patient will pick up on Tuesday and he was given 2 days of nitrofurantoin that he will get through [facility] until he goes to the [place] to pick up the remainder of prescription.  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  He was noted of elevated LFTs and a negative hepatitis panel and LFTs continues to decline.  Will defer further follow-up to his outpatient provider.  He will continue his home medications for his seizure disorder and his hypertension.   Discharge Diagnoses:  Principal Problem:   Sepsis ([place]) Active Problems:   Hypertension   Seizure ([place])   CKD (chronic kidney disease), stage III (HCC)   UTI (urinary tract infection)   Elevated LFTs   Urinary hesitancy       Discharge Instructions   Discharge Instructions       Call MD for:  extreme fatigue   Complete by: As directed      Call MD for:  persistant dizziness or light-headedness   Complete by: As directed      Call MD for:  persistant nausea and vomiting   Complete by: As directed      Call MD for:  severe uncontrolled pain   Complete by: As directed      Call MD for:  temperature >100.4   Complete by: As directed      Diet - low sodium heart healthy   Complete by: As directed      Discharge instructions   Complete by: As directed      Return to Er for any acute change in medical condition    Increase activity slowly   Complete by: As directed           Allergies as of 04/17/2022   No Known Allergies         Medication List       STOP taking these medications     amoxicillin-clavulanate 875-125 MG tablet Commonly known as:  AUGMENTIN    penicillin v potassium 500 MG tablet Commonly known as: VEETID           TAKE these medications     acetaminophen 500 MG tablet Commonly known as: TYLENOL Take 500 mg by mouth daily as needed for fever or headache (pain).    aspirin 81 MG chewable tablet Chew 81 mg by mouth daily.    Capsaicin 0.1 % Crea Apply 1 application. topically 2 (two) times daily as needed (pain).    diltiazem 240 MG 24 hr capsule Commonly known as: DILACOR XR Take 240 mg by mouth daily. When able to remember    divalproex 500 MG DR tablet Commonly known as: DEPAKOTE Take 2 tablets (1,000 mg total) by mouth every 12 (twelve) hours. What changed:  when to take this additional instructions    feeding supplement (GLUCERNA SHAKE) Liqd Take 1 Container by mouth See admin instructions. Drink 1  container 1-3 times a day    hydrALAZINE 50 MG tablet Commonly known as: APRESOLINE Take 50 mg by mouth in the morning and at bedtime. When able to remember What changed: Another medication with the same name was removed. Continue taking this medication, and follow the directions you see here.    levETIRAcetam 1000 MG tablet Commonly known as: KEPPRA Take 1 tablet (1,000 mg total) by mouth 2 (two) times daily. What changed:  when to take this additional instructions    MUCINEX NIGHTSHIFT SINUS PO Take 10 mLs by mouth daily as needed (fever/cough).    nitrofurantoin (macrocrystal-monohydrate) 100 MG capsule Commonly known as: Macrobid Take 1 capsule (100 mg total) by mouth 2 (two) times daily for 7 days.    nitrofurantoin (macrocrystal-monohydrate) 100 MG capsule Commonly known as: Macrobid Take 1 capsule (100 mg total) by mouth 2 (two) times daily for 2 days.    oxcarbazepine 600 MG tablet Commonly known as: TRILEPTAL Take 1 tablet (600 mg total) by mouth 2 (two) times daily. What changed:  when to take this additional instructions    sildenafil 100 MG tablet Commonly known as:  VIAGRA Take 50 mg by mouth See admin instructions. Take 50 mg by mouth 1 hour prior to sexual activity. Do not exceed 1 dose per 24 hours    SUMAtriptan 50 MG tablet Commonly known as: IMITREX Take 50 mg by mouth See admin instructions. Take 50 mg by mouth at the onset of headache, then repeat in 2 hours if headache persists or recurs. No more than 200 mg a day    VITAMIN D3 PO Take 1 capsule by mouth daily.             No Known Allergies   Consultations: None     Procedures/Studies: US Abdomen Complete   Result Date: 04/15/2022 CLINICAL DATA:  Acute kidney injury and elevated liver function tests. EXAM: ABDOMEN ULTRASOUND COMPLETE COMPARISON:  None Available. FINDINGS: Gallbladder: No visualized. No sonographic Murphy sign noted by sonographer. Borderline gallbladder wall thickening at 0.3 cm; the gallbladder small in caliber and nondistended (resembling contraction) despite the patient's last meal being 15 hours prior to imaging. Small amount of sludge in the gallbladder. Common bile duct: Diameter: 0.3 cm Liver: No focal lesion identified. Within normal limits in parenchymal echogenicity. Portal vein is patent on color Doppler imaging with normal direction of blood flow towards the liver. IVC: No abnormality visualized. Pancreas: Visualized portion unremarkable. Pancreatic tail not seen due to overlying bowel gas. Spleen: Size and appearance within normal limits. Right Kidney: Length: 8.3 cm. Mildly accentuated echogenicity. No mass or hydronephrosis visualized. Left Kidney: Length: 9.8 cm. Mildly accentuated echogenicity. No mass or hydronephrosis visualized. Abdominal aorta: No aneurysm visualized. Other findings: Reduced sensitivity due to motion artifact and bowel gas. IMPRESSION: 1. Mildly accentuated echogenicity of the kidneys bilaterally, potentially from chronic medical renal disease. 2. Although the patient's last meal was 15 hours prior to imaging, there borderline gallbladder  wall thickening and a small contracted appearance of the gallbladder. Sonographic Murphy's sign absent, no gallstones identified. Small amount of sludge noted in the gallbladder. If there is concern for gallbladder dysfunction, nuclear medicine hepatobiliary scan with gallbladder ejection fraction calculation is an option for further assessment. 3. Pancreatic tail not well seen due to overlying bowel gas. Electronically Signed   By: Van Clines M.D.   On: 04/15/2022 12:45    DG Chest Port 1 View   Result Date: 04/15/2022 CLINICAL DATA:  Questionable sepsis.  Fever EXAM: PORTABLE CHEST 1 VIEW COMPARISON:  08/24/2018 FINDINGS: Artifact from EKG leads. Normal heart size and mediastinal contours. No acute infiltrate or edema. No effusion or pneumothorax. No acute osseous findings. IMPRESSION: Negative portable chest. Electronically Signed   By: Jorje Guild M.D.   On: 04/15/2022 04:34   Imaging Results          Subjective: Patient doing well he is at his baseline without complication.   Discharge Exam:     Vitals:    04/16/22 2223 04/17/22 0514  BP: (!) 143/75 133/76  Pulse: 65 (!) 57  Resp: 16 16  Temp: 98.2 F (36.8 C) 98.2 F (36.8 C)  SpO2: 96% 99%          Vitals:    04/16/22 1312 04/16/22 2044 04/16/22 2223 04/17/22 0514  BP: 130/75   (!) 143/75 133/76  Pulse: 60   65 (!) 57  Resp: 19   16 16   Temp: 98.3 F (36.8 C) 98.2 F (36.8 C) 98.2 F (36.8 C) 98.2 F (36.8 C)  TempSrc: Oral Oral Oral Oral  SpO2: 99%   96% 99%  Weight:          Height:              General: Pt is alert, awake, not in acute distress Cardiovascular: RRR, S1/S2 +, no rubs, no gallops Respiratory: CTA bilaterally, no wheezing, no rhonchi Abdominal: Soft, NT, ND, bowel sounds + Extremities: no edema, no cyanosis       The results of significant diagnostics from this hospitalization (including imaging, microbiology, ancillary and laboratory) are listed below for reference.        Microbiology:        Recent Results (from the past 240 hour(s))  Resp Panel by RT-PCR (Flu A&B, Covid) Anterior Nasal Swab     Status: None    Collection Time: 04/15/22  4:12 AM    Specimen: Anterior Nasal Swab  Result Value Ref Range Status    SARS Coronavirus 2 by RT PCR NEGATIVE NEGATIVE Final      Comment: (NOTE) SARS-CoV-2 target nucleic acids are NOT DETECTED.   The SARS-CoV-2 RNA is generally detectable in upper respiratory specimens during the acute phase of infection. The lowest concentration of SARS-CoV-2 viral copies this assay can detect is 138 copies/mL. A negative result does not preclude SARS-Cov-2 infection and should not be used as the sole basis for treatment or other patient management decisions. A negative result may occur with  improper specimen collection/handling, submission of specimen other than nasopharyngeal swab, presence of viral mutation(s) within the areas targeted by this assay, and inadequate number of viral copies(<138 copies/mL). A negative result must be combined with clinical observations, patient history, and epidemiological information. The expected result is Negative.   Fact Sheet for Patients:  EntrepreneurPulse.com.au   Fact Sheet for Healthcare Providers:  IncredibleEmployment.be   This test is no t yet approved or cleared by the [place] FDA and  has been authorized for detection and/or diagnosis of SARS-CoV-2 by FDA under an Emergency Use Authorization (EUA). This EUA will remain  in effect (meaning this test can be used) for the duration of the COVID-19 declaration under Section 564(b)(1) of the Act, 21 U.S.C.section 360bbb-3(b)(1), unless the authorization is terminated  or revoked sooner.           Influenza A by PCR NEGATIVE NEGATIVE Final    Influenza B by PCR NEGATIVE NEGATIVE Final      Comment: (  NOTE) The Xpert Xpress SARS-CoV-2/FLU/RSV plus assay is intended as an aid in the  diagnosis of influenza from Nasopharyngeal swab specimens and should not be used as a sole basis for treatment. Nasal washings and aspirates are unacceptable for Xpert Xpress SARS-CoV-2/FLU/RSV testing.   Fact Sheet for Patients: EntrepreneurPulse.com.au   Fact Sheet for Healthcare Providers: IncredibleEmployment.be   This test is not yet approved or cleared by the [place] FDA and has been authorized for detection and/or diagnosis of SARS-CoV-2 by FDA under an Emergency Use Authorization (EUA). This EUA will remain in effect (meaning this test can be used) for the duration of the COVID-19 declaration under Section 564(b)(1) of the Act, 21 U.S.C. section 360bbb-3(b)(1), unless the authorization is terminated or revoked.   Performed at [facility], [address] [place], [place] [address]    Blood Culture (routine x 2)     Status: None (Preliminary result)    Collection Time: 04/15/22  4:12 AM    Specimen: BLOOD  Result Value Ref Range Status    Specimen Description     Final      BLOOD Performed at [facility], [address] [place], [place] [address]      Special Requests     Final      NONE Performed at [facility], [address], [place], [place] [address]      Culture     Final      NO GROWTH < 24 HOURS Performed at [facility], [place] [address] [place], [place] [address]      Report Status PENDING   Incomplete  Blood Culture (routine x 2)     Status: None (Preliminary result)    Collection Time: 04/15/22  4:12 AM    Specimen: BLOOD  Result Value Ref Range Status    Specimen Description     Final      BLOOD Performed at [facility], [address] [place], [place] [address]      Special Requests     Final      NONE Performed at [facility], [address], [place], [place] [address]      Culture     Final       NO GROWTH < 24 HOURS Performed at [facility], [place] [address], [place], [place] [address]      Report Status PENDING   Incomplete  Urine Culture     Status: Abnormal    Collection Time: 04/15/22  4:12 AM    Specimen: In/Out Cath Urine  Result Value Ref Range Status    Specimen Description     Final      IN/OUT CATH URINE Performed at [facility], [address] [place], [place] [address]      Special Requests     Final      NONE Performed at [facility], [place], [place] [address]      Culture 1,000 COLONIES/mL ESCHERICHIA COLI (A)   Final    Report Status 04/17/2022 FINAL   Final    Organism ID, Bacteria ESCHERICHIA COLI (A)   Final      Susceptibility    Escherichia coli - MIC*      AMPICILLIN <=2 SENSITIVE Sensitive        CEFAZOLIN <=4 SENSITIVE Sensitive        CEFEPIME <=0.12 SENSITIVE Sensitive        CEFTRIAXONE <=0.25 SENSITIVE  Sensitive        CIPROFLOXACIN <=0.25 SENSITIVE Sensitive        GENTAMICIN <=1 SENSITIVE Sensitive        IMIPENEM <=0.25 SENSITIVE Sensitive        NITROFURANTOIN <=16 SENSITIVE Sensitive        TRIMETH/SULFA <=20 SENSITIVE Sensitive        AMPICILLIN/SULBACTAM <=2 SENSITIVE Sensitive        PIP/TAZO <=4 SENSITIVE Sensitive       * 1,000 COLONIES/mL ESCHERICHIA COLI      Labs: BNP (last 3 results) Recent Labs (within last 365 days)  No results for input(s): BNP in the last 8760 hours.   Basic Metabolic Panel: Last Labs        Recent Labs  Lab 04/15/22 0412 04/16/22 0602 04/17/22 0550  NA 137 140 138  K 3.7 3.8 3.6  CL 104 108 107  CO2 21* 23 23  GLUCOSE 118* 92 107*  BUN 23 26* 25*  CREATININE 1.86* 1.85* 1.46*  CALCIUM 8.9 8.4* 8.2*      Liver Function Tests: Last Labs        Recent Labs  Lab 04/15/22 0412 04/16/22 0602 04/17/22 0550  AST 122* 146* 69*  ALT 226* 245* 178*  ALKPHOS 112 126 113  BILITOT 0.4 0.8 0.7  PROT 7.0 6.0* 6.2*  ALBUMIN  3.8 2.8* 2.8*      Last Labs   No results for input(s): LIPASE, AMYLASE in the last 168 hours.   Last Labs   No results for input(s): AMMONIA in the last 168 hours.   CBC: Last Labs        Recent Labs  Lab 04/15/22 0412 04/16/22 0602 04/17/22 0550  WBC 5.9 9.7 10.6*  NEUTROABS 5.0  --  6.2  HGB 13.4 12.5* 12.8*  HCT 40.2 38.1* 38.6*  MCV 93.7 98.2 98.5  PLT 151 130* 143*      Cardiac Enzymes: Last Labs   No results for input(s): CKTOTAL, CKMB, CKMBINDEX, TROPONINI in the last 168 hours.   BNP: Last Labs   Invalid input(s): POCBNP   CBG: Last Labs   No results for input(s): GLUCAP in the last 168 hours.   D-Dimer Recent Labs (last 2 labs)   No results for input(s): DDIMER in the last 72 hours.   Hgb A1c Recent Labs (last 2 labs)   No results for input(s): HGBA1C in the last 72 hours.   Lipid Profile Recent Labs (last 2 labs)   No results for input(s): CHOL, HDL, LDLCALC, TRIG, CHOLHDL, LDLDIRECT in the last 72 hours.   Thyroid function studies  Recent Labs (last 2 labs)   No results for input(s): TSH, T4TOTAL, T3FREE, THYROIDAB in the last 72 hours.   Invalid input(s): FREET3   Anemia work up [employer] (last 2 labs)   No results for input(s): VITAMINB12, FOLATE, FERRITIN, TIBC, IRON, RETICCTPCT in the last 72 hours.   Urinalysis Labs (Brief)          Component Value Date/Time    COLORURINE YELLOW 04/15/2022 0412    APPEARANCEUR HAZY (A) 04/15/2022 0412    LABSPEC 1.019 04/15/2022 0412    PHURINE 7.5 04/15/2022 0412    GLUCOSEU NEGATIVE 04/15/2022 0412    HGBUR MODERATE (A) 04/15/2022 0412    BILIRUBINUR NEGATIVE 04/15/2022 0412    KETONESUR TRACE (A) 04/15/2022 0412    PROTEINUR 100 (A) 04/15/2022 0412    NITRITE NEGATIVE 04/15/2022 0412    LEUKOCYTESUR LARGE (  A) 04/15/2022 0412      Sepsis Labs Last Labs   Invalid input(s): PROCALCITONIN,  WBC,  LACTICIDVEN   Microbiology        Recent Results (from the past 240 hour(s))  Resp  Panel by RT-PCR (Flu A&B, Covid) Anterior Nasal Swab     Status: None    Collection Time: 04/15/22  4:12 AM    Specimen: Anterior Nasal Swab  Result Value Ref Range Status    SARS Coronavirus 2 by RT PCR NEGATIVE NEGATIVE Final      Comment: (NOTE) SARS-CoV-2 target nucleic acids are NOT DETECTED.   The SARS-CoV-2 RNA is generally detectable in upper respiratory specimens during the acute phase of infection. The lowest concentration of SARS-CoV-2 viral copies this assay can detect is 138 copies/mL. A negative result does not preclude SARS-Cov-2 infection and should not be used as the sole basis for treatment or other patient management decisions. A negative result may occur with  improper specimen collection/handling, submission of specimen other than nasopharyngeal swab, presence of viral mutation(s) within the areas targeted by this assay, and inadequate number of viral copies(<138 copies/mL). A negative result must be combined with clinical observations, patient history, and epidemiological information. The expected result is Negative.   Fact Sheet for Patients:  EntrepreneurPulse.com.au   Fact Sheet for Healthcare Providers:  IncredibleEmployment.be   This test is no t yet approved or cleared by the [place] FDA and  has been authorized for detection and/or diagnosis of SARS-CoV-2 by FDA under an Emergency Use Authorization (EUA). This EUA will remain  in effect (meaning this test can be used) for the duration of the COVID-19 declaration under Section 564(b)(1) of the Act, 21 U.S.C.section 360bbb-3(b)(1), unless the authorization is terminated  or revoked sooner.           Influenza A by PCR NEGATIVE NEGATIVE Final    Influenza B by PCR NEGATIVE NEGATIVE Final      Comment: (NOTE) The Xpert Xpress SARS-CoV-2/FLU/RSV plus assay is intended as an aid in the diagnosis of influenza from Nasopharyngeal swab specimens and should not be  used as a sole basis for treatment. Nasal washings and aspirates are unacceptable for Xpert Xpress SARS-CoV-2/FLU/RSV testing.   Fact Sheet for Patients: EntrepreneurPulse.com.au   Fact Sheet for Healthcare Providers: IncredibleEmployment.be   This test is not yet approved or cleared by the [place] FDA and has been authorized for detection and/or diagnosis of SARS-CoV-2 by FDA under an Emergency Use Authorization (EUA). This EUA will remain in effect (meaning this test can be used) for the duration of the COVID-19 declaration under Section 564(b)(1) of the Act, 21 U.S.C. section 360bbb-3(b)(1), unless the authorization is terminated or revoked.   Performed at [facility], [address], [place], [place] [address]    Blood Culture (routine x 2)     Status: None (Preliminary result)    Collection Time: 04/15/22  4:12 AM    Specimen: BLOOD  Result Value Ref Range Status    Specimen Description     Final      BLOOD Performed at [facility], [address], [place], [place] [address]      Special Requests     Final      NONE Performed at [facility], [address], [place], [place] [address]      Culture     Final      NO GROWTH < 24 HOURS Performed at [facility],  [address], [place], [place] [address]      Report Status PENDING   Incomplete  Blood Culture (routine x 2)     Status: None (Preliminary result)    Collection Time: 04/15/22  4:12 AM    Specimen: BLOOD  Result Value Ref Range Status    Specimen Description     Final      BLOOD Performed at [facility], [address] [place], [place] [address]      Special Requests     Final      NONE Performed at [facility], [address], [place], [place] [address]      Culture     Final      NO GROWTH < 24 HOURS Performed at [facility], [place] [address], [place], [place] [address]      Report Status PENDING   Incomplete  Urine Culture     Status: Abnormal    Collection Time: 04/15/22  4:12 AM    Specimen: In/Out Cath Urine  Result Value Ref Range Status    Specimen Description     Final      IN/OUT CATH URINE Performed at [facility], [address] [place], [place] [address]      Special Requests     Final      NONE Performed at [facility], [place], [place] [address]      Culture 1,000 COLONIES/mL ESCHERICHIA COLI (A)   Final    Report Status 04/17/2022 FINAL   Final    Organism ID, Bacteria ESCHERICHIA COLI (A)   Final      Susceptibility    Escherichia coli - MIC*      AMPICILLIN <=2 SENSITIVE Sensitive        CEFAZOLIN <=4 SENSITIVE Sensitive        CEFEPIME <=0.12 SENSITIVE Sensitive        CEFTRIAXONE <=0.25 SENSITIVE Sensitive        CIPROFLOXACIN <=0.25 SENSITIVE Sensitive        GENTAMICIN <=1 SENSITIVE Sensitive        IMIPENEM <=0.25 SENSITIVE Sensitive        NITROFURANTOIN <=16 SENSITIVE Sensitive        TRIMETH/SULFA <=20 SENSITIVE Sensitive        AMPICILLIN/SULBACTAM <=2 SENSITIVE Sensitive        PIP/TAZO <=4 SENSITIVE Sensitive       * 1,000 COLONIES/mL ESCHERICHIA COLI        Time coordinating discharge: Over 30 minutes   SIGNED:     Nicolette Bang, MD    [facility] 04/17/2022, 1:16 PM Pager    If 7PM-7AM, please contact night-coverage [email or web address] Password [email or web address]          Note Details  Author Marcell Anger, MD File Time 04/17/2022  1:20 PM  Author Type Physician Status Signed  Last Editor Marcell Anger, [place] # [id number] [place] Date 04/15/2022     Time coordinating discharge: Over 30 minutes  SIGNED:   Nicolette Bang, MD  [facility] 04/17/2022, 5:20 PM Pager   If 7PM-7AM, please contact night-coverage [email or web address] Password [email or web address]

## 2022-04-17 NOTE — Plan of Care (Signed)
Patient ID: Raymond Cox, male   DOB: 1961/04/13, 61 y.o.   MRN: [id number]  Problem: Education: Goal: Knowledge of General Education information will improve Description: Including pain rating scale, medication(s)/side effects and non-pharmacologic comfort measures 04/17/2022 1430 by Lidia Collum, RN Outcome: Adequate for Discharge 04/17/2022 1156 by Lidia Collum, RN Outcome: Progressing   Problem: Health Behavior/Discharge Planning: Goal: Ability to manage health-related needs will improve 04/17/2022 1430 by Lidia Collum, RN Outcome: Adequate for Discharge 04/17/2022 1156 by Lidia Collum, RN Outcome: Progressing   Problem: Clinical Measurements: Goal: Ability to maintain clinical measurements within normal limits will improve 04/17/2022 1430 by Lidia Collum, RN Outcome: Adequate for Discharge 04/17/2022 1156 by Lidia Collum, RN Outcome: Progressing Goal: Will remain free from infection 04/17/2022 1430 by Lidia Collum, RN Outcome: Adequate for Discharge 04/17/2022 1156 by Lidia Collum, RN Outcome: Progressing Goal: Diagnostic test results will improve 04/17/2022 1430 by Lidia Collum, RN Outcome: Adequate for Discharge 04/17/2022 1156 by Lidia Collum, RN Outcome: Progressing Goal: Respiratory complications will improve 04/17/2022 1430 by Lidia Collum, RN Outcome: Adequate for Discharge 04/17/2022 1156 by Lidia Collum, RN Outcome: Progressing Goal: Cardiovascular complication will be avoided 04/17/2022 1430 by Lidia Collum, RN Outcome: Adequate for Discharge 04/17/2022 1156 by Lidia Collum, RN Outcome: Progressing   Problem: Activity: Goal: Risk for activity intolerance will decrease 04/17/2022 1430 by Lidia Collum, RN Outcome: Adequate for Discharge 04/17/2022 1156 by Lidia Collum, RN Outcome: Progressing   Problem: Nutrition: Goal: Adequate nutrition will be maintained 04/17/2022 1430 by Lidia Collum, RN Outcome: Adequate for Discharge 04/17/2022 1156 by Lidia Collum, RN Outcome: Progressing   Problem: Coping: Goal: Level of anxiety will decrease 04/17/2022 1430 by Lidia Collum, RN Outcome: Adequate for Discharge 04/17/2022 1156 by Lidia Collum, RN Outcome: Progressing   Problem: Elimination: Goal: Will not experience complications related to bowel motility 04/17/2022 1430 by Lidia Collum, RN Outcome: Adequate for Discharge 04/17/2022 1156 by Lidia Collum, RN Outcome: Progressing Goal: Will not experience complications related to urinary retention 04/17/2022 1430 by Lidia Collum, RN Outcome: Adequate for Discharge 04/17/2022 1156 by Lidia Collum, RN Outcome: Progressing   Problem: Pain Managment: Goal: General experience of comfort will improve 04/17/2022 1430 by Lidia Collum, RN Outcome: Adequate for Discharge 04/17/2022 1156 by Lidia Collum, RN Outcome: Progressing   Problem: Safety: Goal: Ability to remain free from injury will improve 04/17/2022 1430 by Lidia Collum, RN Outcome: Adequate for Discharge 04/17/2022 1156 by Lidia Collum, RN Outcome: Progressing   Problem: Skin Integrity: Goal: Risk for impaired skin integrity will decrease 04/17/2022 1430 by Lidia Collum, RN Outcome: Adequate for Discharge 04/17/2022 1156 by Lidia Collum, RN Outcome: Progressing    Lidia Collum, RN

## 2022-04-17 NOTE — Plan of Care (Signed)

## 2022-04-17 NOTE — TOC Transition Note (Signed)
Transition of Care [facility]) - CM/SW Discharge Note   Patient Details  Name: Raymond Cox MRN: [id number] Date of Birth: Jan 17, 1961  [facility] CM/SW Contact:  Dessa Phi, RN Phone Number: 04/17/2022, 3:49 PM   Clinical Narrative: Alvis Lemmings for [facility],aide-patient's sister Tye [place] in agreement. Billing medicare. Has own transport home. No further CM needs.     Final next level of care: [place] Barriers to Discharge: No Barriers Identified   Patient Goals and CMS Choice Patient states their goals for this hospitalization and ongoing recovery are:: home CMS Medicare.gov Compare Post Acute Care list provided to:: Patient Represenative (must comment) (Cathy sister) Choice offered to / list presented to : Sibling  Discharge Placement                       Discharge Plan and Services   Discharge Planning Services: CM Consult Post Acute Care Choice: Home Health                    HH Arranged: RN, Nurse's Aide [facility] Agency: [place] Date [facility] Agency Contacted: 04/17/22 Time [place]: [phone] Representative spoke with at [place]: Cindie  Social Determinants of Health ([place]) Interventions     Readmission Risk Interventions     View : No data to display.

## 2022-04-17 NOTE — Progress Notes (Signed)
Physician Discharge Summary  DEAGLAN REISER WUJ:[id number] DOB: Dec 25, 1960 DOA: 04/15/2022  PCP: [facility]  Admit date: 04/15/2022 Discharge date: 04/17/2022  Admitted From: Inpatient Disposition: home  Recommendations for Outpatient Follow-up:  Follow up with PCP in 1-2 weeks   Home Health:No Equipment/Devices:no new equipment  Discharge Condition:Good CODE STATUS:Full code Diet recommendation: Regular healthy diet  Brief/Interim Summary:  Raymond Cox is a 61 y.o. male with medical history significant of HTN, CKD3a, seizure d/o. Presenting with fevers and chills. His wife reports that he started having fevers about a week ago. They ranged from 99 - 103. He has had accompanying chills; but no nausea, vomiting, diarrhea, cough or respiratory distress. He has not had any sick contacts. He has had urinary hesitancy but no burning or change in urine appearance. He has taken APAP which has provided temporary relief. When his symptoms did not improve last night, he decided to come to the ED for assistance  Patient was diagnosed with sepsis with urinary tract infection.  He was initially placed on broad-spectrum antibiotics with blood cultures and urine cultures obtained.  Blood cultures have been negative urine culture was positive for E. coli which was pansensitive.  Patient has been afebrile, normotensive and near baseline.  He received fluids and is stable.  He will be discharged home to complete a course of nitrofurantoin.  Patient requested for 2 prescriptions 1 was sent to the [facility] which patient will pick up on Tuesday and he was given 2 days of nitrofurantoin that he will get through [facility] until he goes to the [place] to pick up the remainder of prescription.  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  He was noted of elevated LFTs and a negative hepatitis panel and LFTs continues to decline.   Will defer further follow-up to his outpatient provider.  He will continue his home medications for his seizure disorder and his hypertension.  Discharge Diagnoses:  Principal Problem:   Sepsis (HCC) Active Problems:   Hypertension   Seizure (HCC)   CKD (chronic kidney disease), stage III (HCC)   UTI (urinary tract infection)   Elevated LFTs   Urinary hesitancy    Discharge Instructions  Discharge Instructions     Call MD for:  extreme fatigue   Complete by: As directed    Call MD for:  persistant dizziness or light-headedness   Complete by: As directed    Call MD for:  persistant nausea and vomiting   Complete by: As directed    Call MD for:  severe uncontrolled pain   Complete by: As directed    Call MD for:  temperature >100.4   Complete by: As directed    Diet - low sodium heart healthy   Complete by: As directed    Discharge instructions   Complete by: As directed    Return to Er for any acute change in medical condition   Increase activity slowly   Complete by: As directed       Allergies as of 04/17/2022   No Known Allergies      Medication List     STOP taking these medications    amoxicillin-clavulanate 875-125 MG tablet Commonly known as: AUGMENTIN   penicillin v potassium 500 MG tablet Commonly known as: VEETID       TAKE these medications    acetaminophen 500 MG tablet Commonly known as: TYLENOL  Take 500 mg by mouth daily as needed for fever or headache (pain).   aspirin 81 MG chewable tablet Chew 81 mg by mouth daily.   Capsaicin 0.1 % Crea Apply 1 application. topically 2 (two) times daily as needed (pain).   diltiazem 240 MG 24 hr capsule Commonly known as: DILACOR XR Take 240 mg by mouth daily. When able to remember   divalproex 500 MG DR tablet Commonly known as: DEPAKOTE Take 2 tablets (1,000 mg total) by mouth every 12 (twelve) hours. What changed:  when to take this additional instructions   feeding supplement  (GLUCERNA SHAKE) Liqd Take 1 Container by mouth See admin instructions. Drink 1 container 1-3 times a day   hydrALAZINE 50 MG tablet Commonly known as: APRESOLINE Take 50 mg by mouth in the morning and at bedtime. When able to remember What changed: Another medication with the same name was removed. Continue taking this medication, and follow the directions you see here.   levETIRAcetam 1000 MG tablet Commonly known as: KEPPRA Take 1 tablet (1,000 mg total) by mouth 2 (two) times daily. What changed:  when to take this additional instructions   MUCINEX NIGHTSHIFT SINUS PO Take 10 mLs by mouth daily as needed (fever/cough).   nitrofurantoin (macrocrystal-monohydrate) 100 MG capsule Commonly known as: Macrobid Take 1 capsule (100 mg total) by mouth 2 (two) times daily for 7 days.   nitrofurantoin (macrocrystal-monohydrate) 100 MG capsule Commonly known as: Macrobid Take 1 capsule (100 mg total) by mouth 2 (two) times daily for 2 days.   oxcarbazepine 600 MG tablet Commonly known as: TRILEPTAL Take 1 tablet (600 mg total) by mouth 2 (two) times daily. What changed:  when to take this additional instructions   sildenafil 100 MG tablet Commonly known as: VIAGRA Take 50 mg by mouth See admin instructions. Take 50 mg by mouth 1 hour prior to sexual activity. Do not exceed 1 dose per 24 hours   SUMAtriptan 50 MG tablet Commonly known as: IMITREX Take 50 mg by mouth See admin instructions. Take 50 mg by mouth at the onset of headache, then repeat in 2 hours if headache persists or recurs. No more than 200 mg a day   VITAMIN D3 PO Take 1 capsule by mouth daily.        No Known Allergies  Consultations: None   Procedures/Studies: US Abdomen Complete  Result Date: 04/15/2022 CLINICAL DATA:  Acute kidney injury and elevated liver function tests. EXAM: ABDOMEN ULTRASOUND COMPLETE COMPARISON:  None Available. FINDINGS: Gallbladder: No visualized. No sonographic Murphy sign  noted by sonographer. Borderline gallbladder wall thickening at 0.3 cm; the gallbladder small in caliber and nondistended (resembling contraction) despite the patient's last meal being 15 hours prior to imaging. Small amount of sludge in the gallbladder. Common bile duct: Diameter: 0.3 cm Liver: No focal lesion identified. Within normal limits in parenchymal echogenicity. Portal vein is patent on color Doppler imaging with normal direction of blood flow towards the liver. IVC: No abnormality visualized. Pancreas: Visualized portion unremarkable. Pancreatic tail not seen due to overlying bowel gas. Spleen: Size and appearance within normal limits. Right Kidney: Length: 8.3 cm. Mildly accentuated echogenicity. No mass or hydronephrosis visualized. Left Kidney: Length: 9.8 cm. Mildly accentuated echogenicity. No mass or hydronephrosis visualized. Abdominal aorta: No aneurysm visualized. Other findings: Reduced sensitivity due to motion artifact and bowel gas. IMPRESSION: 1. Mildly accentuated echogenicity of the kidneys bilaterally, potentially from chronic medical renal disease. 2. Although the patient's last meal was 15  hours prior to imaging, there borderline gallbladder wall thickening and a small contracted appearance of the gallbladder. Sonographic Murphy's sign absent, no gallstones identified. Small amount of sludge noted in the gallbladder. If there is concern for gallbladder dysfunction, nuclear medicine hepatobiliary scan with gallbladder ejection fraction calculation is an option for further assessment. 3. Pancreatic tail not well seen due to overlying bowel gas. Electronically Signed   By: Van Clines M.D.   On: 04/15/2022 12:45   DG Chest Port 1 View  Result Date: 04/15/2022 CLINICAL DATA:  Questionable sepsis.  Fever EXAM: PORTABLE CHEST 1 VIEW COMPARISON:  08/24/2018 FINDINGS: Artifact from EKG leads. Normal heart size and mediastinal contours. No acute infiltrate or edema. No effusion or  pneumothorax. No acute osseous findings. IMPRESSION: Negative portable chest. Electronically Signed   By: Jorje Guild M.D.   On: 04/15/2022 04:34      Subjective: Patient doing well he is at his baseline without complication.  Discharge Exam: Vitals:   04/16/22 2223 04/17/22 0514  BP: (!) 143/75 133/76  Pulse: 65 (!) 57  Resp: 16 16  Temp: 98.2 F (36.8 C) 98.2 F (36.8 C)  SpO2: 96% 99%   Vitals:   04/16/22 1312 04/16/22 2044 04/16/22 2223 04/17/22 0514  BP: 130/75  (!) 143/75 133/76  Pulse: 60  65 (!) 57  Resp: 19  16 16   Temp: 98.3 F (36.8 C) 98.2 F (36.8 C) 98.2 F (36.8 C) 98.2 F (36.8 C)  TempSrc: Oral Oral Oral Oral  SpO2: 99%  96% 99%  Weight:      Height:        General: Pt is alert, awake, not in acute distress Cardiovascular: RRR, S1/S2 +, no rubs, no gallops Respiratory: CTA bilaterally, no wheezing, no rhonchi Abdominal: Soft, NT, ND, bowel sounds + Extremities: no edema, no cyanosis    The results of significant diagnostics from this hospitalization (including imaging, microbiology, ancillary and laboratory) are listed below for reference.     Microbiology: Recent Results (from the past 240 hour(s))  Resp Panel by RT-PCR (Flu A&B, Covid) Anterior Nasal Swab     Status: None   Collection Time: 04/15/22  4:12 AM   Specimen: Anterior Nasal Swab  Result Value Ref Range Status   SARS Coronavirus 2 by RT PCR NEGATIVE NEGATIVE Final    Comment: (NOTE) SARS-CoV-2 target nucleic acids are NOT DETECTED.  The SARS-CoV-2 RNA is generally detectable in upper respiratory specimens during the acute phase of infection. The lowest concentration of SARS-CoV-2 viral copies this assay can detect is 138 copies/mL. A negative result does not preclude SARS-Cov-2 infection and should not be used as the sole basis for treatment or other patient management decisions. A negative result may occur with  improper specimen collection/handling, submission of specimen  other than nasopharyngeal swab, presence of viral mutation(s) within the areas targeted by this assay, and inadequate number of viral copies(<138 copies/mL). A negative result must be combined with clinical observations, patient history, and epidemiological information. The expected result is Negative.  Fact Sheet for Patients:  EntrepreneurPulse.com.au  Fact Sheet for Healthcare Providers:  IncredibleEmployment.be  This test is no t yet approved or cleared by the [place] FDA and  has been authorized for detection and/or diagnosis of SARS-CoV-2 by FDA under an Emergency Use Authorization (EUA). This EUA will remain  in effect (meaning this test can be used) for the duration of the COVID-19 declaration under Section 564(b)(1) of the Act, 21 U.S.C.section 360bbb-3(b)(1), unless the  authorization is terminated  or revoked sooner.       Influenza A by PCR NEGATIVE NEGATIVE Final   Influenza B by PCR NEGATIVE NEGATIVE Final    Comment: (NOTE) The Xpert Xpress SARS-CoV-2/FLU/RSV plus assay is intended as an aid in the diagnosis of influenza from Nasopharyngeal swab specimens and should not be used as a sole basis for treatment. Nasal washings and aspirates are unacceptable for Xpert Xpress SARS-CoV-2/FLU/RSV testing.  Fact Sheet for Patients: EntrepreneurPulse.com.au  Fact Sheet for Healthcare Providers: IncredibleEmployment.be  This test is not yet approved or cleared by the [place] FDA and has been authorized for detection and/or diagnosis of SARS-CoV-2 by FDA under an Emergency Use Authorization (EUA). This EUA will remain in effect (meaning this test can be used) for the duration of the COVID-19 declaration under Section 564(b)(1) of the Act, 21 U.S.C. section 360bbb-3(b)(1), unless the authorization is terminated or revoked.  Performed at [facility], [address], [place], [place] [address]   Blood Culture (routine x 2)     Status: None (Preliminary result)   Collection Time: 04/15/22  4:12 AM   Specimen: BLOOD  Result Value Ref Range Status   Specimen Description   Final    BLOOD Performed at [facility], [address] [place], [place] [address]    Special Requests   Final    NONE Performed at [facility], [address] [place], [place] [address]    Culture   Final    NO GROWTH < 24 HOURS Performed at [facility], [place] [address] [place], [place] [address]    Report Status PENDING  Incomplete  Blood Culture (routine x 2)     Status: None (Preliminary result)   Collection Time: 04/15/22  4:12 AM   Specimen: BLOOD  Result Value Ref Range Status   Specimen Description   Final    BLOOD Performed at [facility], [address] [place], [place] [address]    Special Requests   Final    NONE Performed at [facility], [address], [place], [place] [address]    Culture   Final    NO GROWTH < 24 HOURS Performed at [facility], [place] [address], [place], [place] [address]    Report Status PENDING  Incomplete  Urine Culture     Status: Abnormal   Collection Time: 04/15/22  4:12 AM   Specimen: In/Out Cath Urine  Result Value Ref Range Status   Specimen Description   Final    IN/OUT CATH URINE Performed at [facility], [address], [place], [place] [address]    Special Requests   Final    NONE Performed at [facility], [place], [place] [address]    Culture 1,000 COLONIES/mL ESCHERICHIA COLI (A)  Final   Report Status 04/17/2022 FINAL  Final   Organism ID, Bacteria ESCHERICHIA COLI (A)  Final      Susceptibility   Escherichia coli - MIC*    AMPICILLIN <=2 SENSITIVE Sensitive     CEFAZOLIN <=4 SENSITIVE Sensitive     CEFEPIME <=0.12 SENSITIVE Sensitive     CEFTRIAXONE  <=0.25 SENSITIVE Sensitive     CIPROFLOXACIN <=0.25 SENSITIVE Sensitive     GENTAMICIN <=1 SENSITIVE Sensitive     IMIPENEM <=0.25 SENSITIVE Sensitive     NITROFURANTOIN <=16 SENSITIVE Sensitive     TRIMETH/SULFA <=20 SENSITIVE Sensitive     AMPICILLIN/SULBACTAM <=2 SENSITIVE  Sensitive     PIP/TAZO <=4 SENSITIVE Sensitive     * 1,000 COLONIES/mL ESCHERICHIA COLI     Labs: BNP (last 3 results) No results for input(s): BNP in the last 8760 hours. Basic Metabolic Panel: Recent Labs  Lab 04/15/22 0412 04/16/22 0602 04/17/22 0550  NA 137 140 138  K 3.7 3.8 3.6  CL 104 108 107  CO2 21* 23 23  GLUCOSE 118* 92 107*  BUN 23 26* 25*  CREATININE 1.86* 1.85* 1.46*  CALCIUM 8.9 8.4* 8.2*   Liver Function Tests: Recent Labs  Lab 04/15/22 0412 04/16/22 0602 04/17/22 0550  AST 122* 146* 69*  ALT 226* 245* 178*  ALKPHOS 112 126 113  BILITOT 0.4 0.8 0.7  PROT 7.0 6.0* 6.2*  ALBUMIN 3.8 2.8* 2.8*   No results for input(s): LIPASE, AMYLASE in the last 168 hours. No results for input(s): AMMONIA in the last 168 hours. CBC: Recent Labs  Lab 04/15/22 0412 04/16/22 0602 04/17/22 0550  WBC 5.9 9.7 10.6*  NEUTROABS 5.0  --  6.2  HGB 13.4 12.5* 12.8*  HCT 40.2 38.1* 38.6*  MCV 93.7 98.2 98.5  PLT 151 130* 143*   Cardiac Enzymes: No results for input(s): CKTOTAL, CKMB, CKMBINDEX, TROPONINI in the last 168 hours. BNP: Invalid input(s): POCBNP CBG: No results for input(s): GLUCAP in the last 168 hours. D-Dimer No results for input(s): DDIMER in the last 72 hours. Hgb A1c No results for input(s): HGBA1C in the last 72 hours. Lipid Profile No results for input(s): CHOL, HDL, LDLCALC, TRIG, CHOLHDL, LDLDIRECT in the last 72 hours. Thyroid function studies No results for input(s): TSH, T4TOTAL, T3FREE, THYROIDAB in the last 72 hours.  Invalid input(s): FREET3 Anemia work up No results for input(s): VITAMINB12, FOLATE, FERRITIN, TIBC, IRON, RETICCTPCT in the last 72  hours. Urinalysis    Component Value Date/Time   COLORURINE YELLOW 04/15/2022 0412   APPEARANCEUR HAZY (A) 04/15/2022 0412   LABSPEC 1.019 04/15/2022 0412   PHURINE 7.5 04/15/2022 0412   GLUCOSEU NEGATIVE 04/15/2022 0412   HGBUR MODERATE (A) 04/15/2022 0412   BILIRUBINUR NEGATIVE 04/15/2022 0412   KETONESUR TRACE (A) 04/15/2022 0412   PROTEINUR 100 (A) 04/15/2022 0412   NITRITE NEGATIVE 04/15/2022 0412   LEUKOCYTESUR LARGE (A) 04/15/2022 0412   Sepsis Labs Invalid input(s): PROCALCITONIN,  WBC,  LACTICIDVEN Microbiology Recent Results (from the past 240 hour(s))  Resp Panel by RT-PCR (Flu A&B, Covid) Anterior Nasal Swab     Status: None   Collection Time: 04/15/22  4:12 AM   Specimen: Anterior Nasal Swab  Result Value Ref Range Status   SARS Coronavirus 2 by RT PCR NEGATIVE NEGATIVE Final    Comment: (NOTE) SARS-CoV-2 target nucleic acids are NOT DETECTED.  The SARS-CoV-2 RNA is generally detectable in upper respiratory specimens during the acute phase of infection. The lowest concentration of SARS-CoV-2 viral copies this assay can detect is 138 copies/mL. A negative result does not preclude SARS-Cov-2 infection and should not be used as the sole basis for treatment or other patient management decisions. A negative result may occur with  improper specimen collection/handling, submission of specimen other than nasopharyngeal swab, presence of viral mutation(s) within the areas targeted by this assay, and inadequate number of viral copies(<138 copies/mL). A negative result must be combined with clinical observations, patient history, and epidemiological information. The expected result is Negative.  Fact Sheet for Patients:  EntrepreneurPulse.com.au  Fact Sheet for Healthcare Providers:  IncredibleEmployment.be  This test is no t yet  approved or cleared by the [place] and  has been authorized for detection and/or diagnosis of  SARS-CoV-2 by FDA under an Emergency Use Authorization (EUA). This EUA will remain  in effect (meaning this test can be used) for the duration of the COVID-19 declaration under Section 564(b)(1) of the Act, 21 U.S.C.section 360bbb-3(b)(1), unless the authorization is terminated  or revoked sooner.       Influenza A by PCR NEGATIVE NEGATIVE Final   Influenza B by PCR NEGATIVE NEGATIVE Final    Comment: (NOTE) The Xpert Xpress SARS-CoV-2/FLU/RSV plus assay is intended as an aid in the diagnosis of influenza from Nasopharyngeal swab specimens and should not be used as a sole basis for treatment. Nasal washings and aspirates are unacceptable for Xpert Xpress SARS-CoV-2/FLU/RSV testing.  Fact Sheet for Patients: EntrepreneurPulse.com.au  Fact Sheet for Healthcare Providers: IncredibleEmployment.be  This test is not yet approved or cleared by the [place] FDA and has been authorized for detection and/or diagnosis of SARS-CoV-2 by FDA under an Emergency Use Authorization (EUA). This EUA will remain in effect (meaning this test can be used) for the duration of the COVID-19 declaration under Section 564(b)(1) of the Act, 21 U.S.C. section 360bbb-3(b)(1), unless the authorization is terminated or revoked.  Performed at [facility], [address], [place], [place] [address]   Blood Culture (routine x 2)     Status: None (Preliminary result)   Collection Time: 04/15/22  4:12 AM   Specimen: BLOOD  Result Value Ref Range Status   Specimen Description   Final    BLOOD Performed at [facility], [address] [place], [place] [address]    Special Requests   Final    NONE Performed at [facility], [address], [place], [place] [address]    Culture   Final    NO GROWTH < 24 HOURS Performed at [facility], [place] [address], [place], [place] [address]    Report Status  PENDING  Incomplete  Blood Culture (routine x 2)     Status: None (Preliminary result)   Collection Time: 04/15/22  4:12 AM   Specimen: BLOOD  Result Value Ref Range Status   Specimen Description   Final    BLOOD Performed at [facility], [address] [place], [place] [address]    Special Requests   Final    NONE Performed at [facility], [address], [place], [place] [address]    Culture   Final    NO GROWTH < 24 HOURS Performed at [facility], [place] [address], [place], [place] [address]    Report Status PENDING  Incomplete  Urine Culture     Status: Abnormal   Collection Time: 04/15/22  4:12 AM   Specimen: In/Out Cath Urine  Result Value Ref Range Status   Specimen Description   Final    IN/OUT CATH URINE Performed at [facility], [address] [place], [place] [address]    Special Requests   Final    NONE Performed at [facility], [place], [place] [address]    Culture 1,000 COLONIES/mL ESCHERICHIA COLI (A)  Final   Report Status 04/17/2022 FINAL  Final   Organism ID, Bacteria ESCHERICHIA COLI (A)  Final      Susceptibility   Escherichia coli - MIC*    AMPICILLIN <=2 SENSITIVE Sensitive     CEFAZOLIN <=4 SENSITIVE Sensitive     CEFEPIME <=  0.12 SENSITIVE Sensitive     CEFTRIAXONE <=0.25 SENSITIVE Sensitive     CIPROFLOXACIN <=0.25 SENSITIVE Sensitive     GENTAMICIN <=1 SENSITIVE Sensitive     IMIPENEM <=0.25 SENSITIVE Sensitive     NITROFURANTOIN <=16 SENSITIVE Sensitive     TRIMETH/SULFA <=20 SENSITIVE Sensitive     AMPICILLIN/SULBACTAM <=2 SENSITIVE Sensitive     PIP/TAZO <=4 SENSITIVE Sensitive     * 1,000 COLONIES/mL ESCHERICHIA COLI     Time coordinating discharge: Over 30 minutes  SIGNED:   Nicolette Bang, MD  [facility] 04/17/2022, 1:16 PM Pager   If 7PM-7AM, please contact night-coverage [email or web address] Password [email or web address]

## 2022-04-17 NOTE — Plan of Care (Signed)
Patient ID: Raymond Cox, male   DOB: 22-Mar-1961, 61 y.o.   MRN: [id number]  Problem: Education: Goal: Knowledge of General Education information will improve Description: Including pain rating scale, medication(s)/side effects and non-pharmacologic comfort measures Outcome: Progressing   Problem: Health Behavior/Discharge Planning: Goal: Ability to manage health-related needs will improve Outcome: Progressing   Problem: Clinical Measurements: Goal: Ability to maintain clinical measurements within normal limits will improve Outcome: Progressing Goal: Will remain free from infection Outcome: Progressing Goal: Diagnostic test results will improve Outcome: Progressing Goal: Respiratory complications will improve Outcome: Progressing Goal: Cardiovascular complication will be avoided Outcome: Progressing   Problem: Activity: Goal: Risk for activity intolerance will decrease Outcome: Progressing   Problem: Nutrition: Goal: Adequate nutrition will be maintained Outcome: Progressing   Problem: Coping: Goal: Level of anxiety will decrease Outcome: Progressing   Problem: Elimination: Goal: Will not experience complications related to bowel motility Outcome: Progressing Goal: Will not experience complications related to urinary retention Outcome: Progressing   Problem: Pain Managment: Goal: General experience of comfort will improve Outcome: Progressing   Problem: Safety: Goal: Ability to remain free from injury will improve Outcome: Progressing   Problem: Skin Integrity: Goal: Risk for impaired skin integrity will decrease Outcome: Progressing    Lidia Collum, RN

## 2022-04-20 LAB — CULTURE, BLOOD (ROUTINE X 2)
Culture: NO GROWTH
Culture: NO GROWTH

## 2022-12-02 ENCOUNTER — Ambulatory Visit
Admission: EM | Admit: 2022-12-02 | Discharge: 2022-12-02 | Disposition: A | Discharge status: Home / Self Care | Payer: No Typology Code available for payment source | Attending: Physician Assistant | Admitting: Physician Assistant

## 2022-12-02 DIAGNOSIS — Z9189 Other specified personal risk factors, not elsewhere classified: Secondary | ICD-10-CM | POA: Insufficient documentation

## 2022-12-02 DIAGNOSIS — Z1152 Encounter for screening for COVID-19: Secondary | ICD-10-CM | POA: Insufficient documentation
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
## 2022-12-02 NOTE — ED Triage Notes (Signed)
Pt is here for Covid exposure X 2DAY

## 2022-12-02 NOTE — Discharge Instructions (Signed)
COVID test will be completed in 48 hours.  If you do not get a call from this office that indicates the test is negative.  Log onto MyChart to view the test results with the post in 48 hours.

## 2022-12-03 LAB — SARS CORONAVIRUS 2 (TAT 6-24 HRS): SARS Coronavirus 2: POSITIVE — AB

## 2024-06-29 ENCOUNTER — Emergency Department (HOSPITAL_BASED_OUTPATIENT_CLINIC_OR_DEPARTMENT_OTHER)

## 2024-06-29 ENCOUNTER — Inpatient Hospital Stay (HOSPITAL_BASED_OUTPATIENT_CLINIC_OR_DEPARTMENT_OTHER)
Admission: EM | Admit: 2024-06-29 | Discharge: 2024-07-03 | DRG: 871 | Disposition: A | Discharge status: Home Health | Attending: Internal Medicine | Admitting: Internal Medicine

## 2024-06-29 ENCOUNTER — Encounter (HOSPITAL_BASED_OUTPATIENT_CLINIC_OR_DEPARTMENT_OTHER): Payer: Self-pay

## 2024-06-29 ENCOUNTER — Other Ambulatory Visit: Payer: Self-pay

## 2024-06-29 ENCOUNTER — Emergency Department (HOSPITAL_BASED_OUTPATIENT_CLINIC_OR_DEPARTMENT_OTHER): Admitting: Radiology

## 2024-06-29 DIAGNOSIS — R6520 Severe sepsis without septic shock: Secondary | ICD-10-CM | POA: Diagnosis present

## 2024-06-29 DIAGNOSIS — R7401 Elevation of levels of liver transaminase levels: Secondary | ICD-10-CM | POA: Diagnosis present

## 2024-06-29 DIAGNOSIS — R7989 Other specified abnormal findings of blood chemistry: Secondary | ICD-10-CM | POA: Diagnosis present

## 2024-06-29 DIAGNOSIS — Z7982 Long term (current) use of aspirin: Secondary | ICD-10-CM | POA: Diagnosis not present

## 2024-06-29 DIAGNOSIS — Z1152 Encounter for screening for COVID-19: Secondary | ICD-10-CM | POA: Diagnosis not present

## 2024-06-29 DIAGNOSIS — Z83438 Family history of other disorder of lipoprotein metabolism and other lipidemia: Secondary | ICD-10-CM | POA: Diagnosis not present

## 2024-06-29 DIAGNOSIS — E8729 Other acidosis: Secondary | ICD-10-CM | POA: Diagnosis not present

## 2024-06-29 DIAGNOSIS — I129 Hypertensive chronic kidney disease with stage 1 through stage 4 chronic kidney disease, or unspecified chronic kidney disease: Secondary | ICD-10-CM | POA: Diagnosis present

## 2024-06-29 DIAGNOSIS — N170 Acute kidney failure with tubular necrosis: Secondary | ICD-10-CM | POA: Diagnosis present

## 2024-06-29 DIAGNOSIS — Z79899 Other long term (current) drug therapy: Secondary | ICD-10-CM

## 2024-06-29 DIAGNOSIS — A4151 Sepsis due to Escherichia coli [E. coli]: Principal | ICD-10-CM | POA: Diagnosis present

## 2024-06-29 DIAGNOSIS — N1832 Chronic kidney disease, stage 3b: Secondary | ICD-10-CM | POA: Diagnosis present

## 2024-06-29 DIAGNOSIS — Z8249 Family history of ischemic heart disease and other diseases of the circulatory system: Secondary | ICD-10-CM

## 2024-06-29 DIAGNOSIS — G40909 Epilepsy, unspecified, not intractable, without status epilepticus: Secondary | ICD-10-CM | POA: Diagnosis present

## 2024-06-29 DIAGNOSIS — R531 Weakness: Secondary | ICD-10-CM

## 2024-06-29 DIAGNOSIS — Z8679 Personal history of other diseases of the circulatory system: Secondary | ICD-10-CM | POA: Diagnosis not present

## 2024-06-29 DIAGNOSIS — R1084 Generalized abdominal pain: Secondary | ICD-10-CM | POA: Diagnosis not present

## 2024-06-29 DIAGNOSIS — A419 Sepsis, unspecified organism: Principal | ICD-10-CM

## 2024-06-29 DIAGNOSIS — Z8042 Family history of malignant neoplasm of prostate: Secondary | ICD-10-CM

## 2024-06-29 DIAGNOSIS — N179 Acute kidney failure, unspecified: Secondary | ICD-10-CM | POA: Diagnosis present

## 2024-06-29 DIAGNOSIS — Z743 Need for continuous supervision: Secondary | ICD-10-CM | POA: Diagnosis not present

## 2024-06-29 DIAGNOSIS — N3001 Acute cystitis with hematuria: Secondary | ICD-10-CM | POA: Diagnosis present

## 2024-06-29 DIAGNOSIS — N12 Tubulo-interstitial nephritis, not specified as acute or chronic: Secondary | ICD-10-CM | POA: Diagnosis present

## 2024-06-29 DIAGNOSIS — Z87898 Personal history of other specified conditions: Secondary | ICD-10-CM | POA: Diagnosis not present

## 2024-06-29 DIAGNOSIS — E872 Acidosis, unspecified: Secondary | ICD-10-CM | POA: Diagnosis present

## 2024-06-29 DIAGNOSIS — I2510 Atherosclerotic heart disease of native coronary artery without angina pectoris: Secondary | ICD-10-CM | POA: Diagnosis present

## 2024-06-29 DIAGNOSIS — N3000 Acute cystitis without hematuria: Secondary | ICD-10-CM | POA: Diagnosis present

## 2024-06-29 LAB — CBC WITH DIFFERENTIAL/PLATELET
Abs Immature Granulocytes: 0.09 K/uL — ABNORMAL HIGH (ref 0.00–0.07)
Basophils Absolute: 0 K/uL (ref 0.0–0.1)
Basophils Relative: 0 %
Eosinophils Absolute: 0 K/uL (ref 0.0–0.5)
Eosinophils Relative: 0 %
HCT: 46.8 % (ref 39.0–52.0)
Hemoglobin: 15.6 g/dL (ref 13.0–17.0)
Immature Granulocytes: 1 %
Lymphocytes Relative: 6 %
Lymphs Abs: 1.1 K/uL (ref 0.7–4.0)
MCH: 31.7 pg (ref 26.0–34.0)
MCHC: 33.3 g/dL (ref 30.0–36.0)
MCV: 95.1 fL (ref 80.0–100.0)
Monocytes Absolute: 3.5 K/uL — ABNORMAL HIGH (ref 0.1–1.0)
Monocytes Relative: 20 %
Neutro Abs: 12.8 K/uL — ABNORMAL HIGH (ref 1.7–7.7)
Neutrophils Relative %: 73 %
Platelets: 161 K/uL (ref 150–400)
RBC: 4.92 MIL/uL (ref 4.22–5.81)
RDW: 13.5 % (ref 11.5–15.5)
WBC: 17.6 K/uL — ABNORMAL HIGH (ref 4.0–10.5)
nRBC: 0 % (ref 0.0–0.2)

## 2024-06-29 LAB — COMPREHENSIVE METABOLIC PANEL WITH GFR
ALT: 175 U/L — ABNORMAL HIGH (ref 0–44)
AST: 127 U/L — ABNORMAL HIGH (ref 15–41)
Albumin: 3.8 g/dL (ref 3.5–5.0)
Alkaline Phosphatase: 176 U/L — ABNORMAL HIGH (ref 38–126)
Anion gap: 17 — ABNORMAL HIGH (ref 5–15)
BUN: 31 mg/dL — ABNORMAL HIGH (ref 8–23)
CO2: 19 mmol/L — ABNORMAL LOW (ref 22–32)
Calcium: 9.3 mg/dL (ref 8.9–10.3)
Chloride: 103 mmol/L (ref 98–111)
Creatinine, Ser: 2.13 mg/dL — ABNORMAL HIGH (ref 0.61–1.24)
GFR, Estimated: 34 mL/min — ABNORMAL LOW (ref >60)
Glucose, Bld: 123 mg/dL — ABNORMAL HIGH (ref 70–99)
Potassium: 4.2 mmol/L (ref 3.5–5.1)
Sodium: 139 mmol/L (ref 135–145)
Total Bilirubin: 0.7 mg/dL (ref 0.0–1.2)
Total Protein: 7.8 g/dL (ref 6.5–8.1)

## 2024-06-29 LAB — URINALYSIS, W/ REFLEX TO CULTURE (INFECTION SUSPECTED)
Bacteria, UA: NONE SEEN
Bilirubin Urine: NEGATIVE
Glucose, UA: NEGATIVE mg/dL
Ketones, ur: NEGATIVE mg/dL
Nitrite: NEGATIVE
Protein, ur: 30 mg/dL — AB
Specific Gravity, Urine: 1.011 (ref 1.005–1.030)
WBC, UA: >50 WBC/hpf (ref 0–5)
pH: 7 (ref 5.0–8.0)

## 2024-06-29 LAB — LACTIC ACID, PLASMA
Lactic Acid, Venous: 1.3 mmol/L (ref 0.5–1.9)
Lactic Acid, Venous: 1.4 mmol/L (ref 0.5–1.9)

## 2024-06-29 LAB — RESP PANEL BY RT-PCR (RSV, FLU A&B, COVID)  RVPGX2
Influenza A by PCR: NEGATIVE
Influenza B by PCR: NEGATIVE
Resp Syncytial Virus by PCR: NEGATIVE
SARS Coronavirus 2 by RT PCR: NEGATIVE

## 2024-06-29 MED ORDER — ACETAMINOPHEN 650 MG RE SUPP: 650.0000 mg | Freq: Four times a day (QID) | RECTAL | Status: DC | PRN

## 2024-06-29 MED ORDER — ONDANSETRON HCL 4 MG/2ML IJ SOLN
4.0000 mg | Freq: Once | INTRAMUSCULAR | Status: AC
  Administered 2024-06-29: 4 mg via INTRAVENOUS
  Filled 2024-06-29: qty 2

## 2024-06-29 MED ORDER — ACETAMINOPHEN 325 MG PO TABS
650.0000 mg | ORAL_TABLET | Freq: Four times a day (QID) | ORAL | Status: DC | PRN
  Administered 2024-06-30 – 2024-07-02 (×10): 650 mg via ORAL
  Filled 2024-06-29 (×7): qty 2

## 2024-06-29 MED ORDER — MORPHINE SULFATE (PF) 4 MG/ML IV SOLN
4.0000 mg | Freq: Once | INTRAVENOUS | Status: AC
  Administered 2024-06-29: 4 mg via INTRAVENOUS
  Filled 2024-06-29: qty 1

## 2024-06-29 MED ORDER — SODIUM CHLORIDE 0.9 % IV BOLUS
1000.0000 mL | Freq: Once | INTRAVENOUS | Status: AC
  Administered 2024-06-29: 1000 mL via INTRAVENOUS

## 2024-06-29 MED ORDER — SODIUM CHLORIDE 0.9 % IV SOLN
1.0000 g | INTRAVENOUS | Status: DC
  Filled 2024-06-29: qty 10

## 2024-06-29 MED ORDER — LACTATED RINGERS IV SOLN: INTRAVENOUS | Status: DC

## 2024-06-29 MED ORDER — NALOXONE HCL 0.4 MG/ML IJ SOLN: 0.4000 mg | INTRAMUSCULAR | Status: DC | PRN

## 2024-06-29 MED ORDER — MELATONIN 3 MG PO TABS: 3.0000 mg | ORAL_TABLET | Freq: Every evening | ORAL | Status: DC | PRN

## 2024-06-29 MED ORDER — HYDROMORPHONE HCL 1 MG/ML IJ SOLN: 0.5000 mg | INTRAMUSCULAR | Status: DC | PRN

## 2024-06-29 MED ORDER — ACETAMINOPHEN 500 MG PO TABS
1000.0000 mg | ORAL_TABLET | Freq: Once | ORAL | Status: AC
  Administered 2024-06-29: 1000 mg via ORAL
  Filled 2024-06-29: qty 2

## 2024-06-29 MED ORDER — SODIUM CHLORIDE 0.9 % IV SOLN
2.0000 g | Freq: Once | INTRAVENOUS | Status: AC
Start: 2024-06-29 — End: 2024-06-29
  Administered 2024-06-29: 2 g via INTRAVENOUS
  Filled 2024-06-29: qty 20

## 2024-06-29 MED ORDER — ONDANSETRON HCL 4 MG/2ML IJ SOLN: 4.0000 mg | Freq: Four times a day (QID) | INTRAMUSCULAR | Status: DC | PRN

## 2024-06-29 NOTE — H&P (Signed)
 History and Physical      Raymond Cox FMW:[id number] DOB: 04-11-1961 DOA: 06/29/2024; DOS: 06/29/2024  PCP: [facility] *** Patient coming from: home ***  I have personally briefly reviewed patient's old medical records in [facility]  Chief Complaint: ***  HPI: Raymond Cox is a 63 y.o. male with medical history significant for *** who is admitted to [facility] on 06/29/2024 with *** after presenting from home*** to [facility] ED complaining of ***.    ***       ***   ED Course:  Vital signs in the ED were notable for the following: ***  Labs were notable for the following: ***  Per my interpretation, EKG in ED demonstrated the following:  ***  Imaging in the ED, per corresponding formal radiology read, was notable for the following:  ***  While in the ED, the following were administered: ***  Subsequently, the patient was admitted  ***  ***red    Review of Systems: As per HPI otherwise 10 point review of systems negative.   Past Medical History:  Diagnosis Date   Coronary artery disease    Hypertension    Renal disorder    stage 3 kidney disease    Seizures (HCC)     Past Surgical History:  Procedure Laterality Date   ABDOMINAL SURGERY     Wife states that patient had history of intestinal resection, but does not know the details.   Left leg surgery      Social History:  reports that he has never smoked. He has never used smokeless tobacco. He reports that he does not currently use alcohol. He reports that he does not use drugs.   No Known Allergies  Family History  Problem Relation Age of Onset   Hypertension Mother    Hyperlipidemia Mother    Prostate cancer Father    Hyperlipidemia Sister    Hypertension Sister    Syncope episode Brother        Wife states that patient's brother has syncope   Deep vein thrombosis Brother     Family history reviewed and not pertinent ***   Prior to Admission medications    Medication Sig Start Date End Date Taking? Authorizing Provider  acetaminophen  (TYLENOL ) 500 MG tablet Take 500 mg by mouth daily as needed for fever or headache (pain).    [provider]  aspirin 81 MG chewable tablet Chew 81 mg by mouth daily. Patient not taking: Reported on 04/15/2022    [provider]  Capsaicin 0.1 % CREA Apply 1 application. topically 2 (two) times daily as needed (pain).    [provider]  Cholecalciferol  (VITAMIN D3 PO) Take 1 capsule by mouth daily. Patient not taking: Reported on 04/16/2022    [provider]  diltiazem  (DILACOR XR ) 240 MG 24 hr capsule Take 240 mg by mouth daily. When able to remember    [provider]  divalproex  (DEPAKOTE ) 500 MG DR tablet Take 2 tablets (1,000 mg total) by mouth every 12 (twelve) hours. Patient taking differently: Take 1,000 mg by mouth daily. When able to remember 05/03/19 04/16/22  Rockey Jolly A, DO  feeding supplement, GLUCERNA SHAKE, (GLUCERNA SHAKE) LIQD Take 1 Container by mouth See admin instructions. Drink 1 container 1-3 times a day    [provider]  hydrALAZINE  (APRESOLINE ) 50 MG tablet Take 50 mg by mouth in the morning and at bedtime. When able to remember    [provider]  levETIRAcetam  (KEPPRA ) 1000 MG tablet Take 1 tablet (1,000 mg total) by mouth 2 (two) times daily. Patient taking differently: Take 1,000 mg by mouth daily. When able to remember 05/03/19 04/15/22  Rockey Jolly A, DO  Oxcarbazepine  (TRILEPTAL ) 600 MG tablet Take 1 tablet (600 mg total) by mouth 2 (two) times daily. Patient taking differently: Take 600 mg by mouth daily. When able to remember 05/03/19 04/16/22  Kyle, Tyrone A, DO  Phenyleph-Triprolidine-DM-APAP (MUCINEX NIGHTSHIFT SINUS PO) Take 10 mLs by mouth daily as needed (fever/cough).    [provider]  sildenafil (VIAGRA) 100 MG tablet Take 50 mg by mouth See admin instructions. Take 50 mg by mouth 1 hour prior to sexual  activity. Do not exceed 1 dose per 24 hours    [provider]  SUMAtriptan (IMITREX) 50 MG tablet Take 50 mg by mouth See admin instructions. Take 50 mg by mouth at the onset of headache, then repeat in 2 hours if headache persists or recurs. No more than 200 mg a day    [provider]     Objective    Physical Exam: Vitals:   06/29/24 2030 06/29/24 2100 06/29/24 2130 06/29/24 2232  BP: (!) 146/74 138/74 138/75 (!) 141/75  Pulse: 80 80 77 75  Resp: 14 16 13 20   Temp:    99.3 F (37.4 C)  TempSrc:      SpO2: 96% 95% 95% 98%  Weight:      Height:        General: appears to be stated age; alert, oriented Skin: warm, dry, no rash Head:  AT/[place] Mouth:  Oral mucosa membranes appear moist, normal dentition Neck: supple; trachea midline Heart:  RRR; did not appreciate any M/R/G Lungs: CTAB, did not appreciate any wheezes, rales, or rhonchi Abdomen: + BS; soft, ND, NT Vascular: 2+ pedal pulses b/l; 2+ radial pulses b/l Extremities: no peripheral edema, no muscle wasting Neuro: strength and sensation intact in upper and lower extremities b/l ***   *** Neuro: 5/5 strength of the proximal and distal flexors and extensors of the upper and lower extremities bilaterally; sensation intact in upper and lower extremities b/l; cranial nerves II through XII grossly intact; no pronator drift; no evidence suggestive of slurred speech, dysarthria, or facial droop; Normal muscle tone. No tremors.  *** Neuro: In the setting of the patient's current mental status and associated inability to follow instructions, unable to perform full neurologic exam at this time.  As such, assessment of strength, sensation, and cranial nerves is limited at this time. Patient noted to spontaneously move all 4 extremities. No tremors.  ***    Labs on Admission: I have personally reviewed following labs and imaging studies  CBC: Recent Labs  Lab 06/29/24 1746  WBC 17.6*  NEUTROABS 12.8*   HGB 15.6  HCT 46.8  MCV 95.1  PLT 161   Basic Metabolic Panel: Recent Labs  Lab 06/29/24 1746  NA 139  K 4.2  CL 103  CO2 19*  GLUCOSE 123*  BUN 31*  CREATININE 2.13*  CALCIUM 9.3   GFR: Estimated Creatinine Clearance: 40.2 mL/min (A) (by C-G formula based on SCr of 2.13 mg/dL (H)). Liver Function Tests: Recent Labs  Lab 06/29/24 1746  AST 127*  ALT 175*  ALKPHOS 176*  BILITOT 0.7  PROT 7.8  ALBUMIN 3.8   No results for input(s): LIPASE, AMYLASE in the last 168 hours. No results for input(s): AMMONIA  in the last 168 hours. Coagulation Profile:  No results for input(s): INR, PROTIME in the last 168 hours. Cardiac Enzymes: No results for input(s): CKTOTAL, CKMB, CKMBINDEX, TROPONINI in the last 168 hours. BNP (last 3 results) No results for input(s): PROBNP in the last 8760 hours. HbA1C: No results for input(s): HGBA1C in the last 72 hours. CBG: No results for input(s): GLUCAP in the last 168 hours. Lipid Profile: No results for input(s): CHOL, HDL, LDLCALC, TRIG, CHOLHDL, LDLDIRECT in the last 72 hours. Thyroid Function Tests: No results for input(s): TSH, T4TOTAL, FREET4, T3FREE, THYROIDAB in the last 72 hours. Anemia Panel: No results for input(s): VITAMINB12, FOLATE, FERRITIN, TIBC, IRON, RETICCTPCT in the last 72 hours. Urine analysis:    Component Value Date/Time   COLORURINE YELLOW 06/29/2024 1746   APPEARANCEUR HAZY (A) 06/29/2024 1746   LABSPEC 1.011 06/29/2024 1746   PHURINE 7.0 06/29/2024 1746   GLUCOSEU NEGATIVE 06/29/2024 1746   HGBUR MODERATE (A) 06/29/2024 1746   BILIRUBINUR NEGATIVE 06/29/2024 1746   KETONESUR NEGATIVE 06/29/2024 1746   PROTEINUR 30 (A) 06/29/2024 1746   NITRITE NEGATIVE 06/29/2024 1746   LEUKOCYTESUR LARGE (A) 06/29/2024 1746    Radiological Exams on Admission: CT ABDOMEN PELVIS WO CONTRAST Result Date: 06/29/2024 CLINICAL DATA:  Abdominal pain, acute,  nonlocalized abd pain, sepsis. Fever. Fatigue, vomiting abdominal pain EXAM: CT ABDOMEN AND PELVIS WITHOUT CONTRAST TECHNIQUE: Multidetector CT imaging of the abdomen and pelvis was performed following the standard protocol without IV contrast. RADIATION DOSE REDUCTION: This exam was performed according to the departmental dose-optimization program which includes automated exposure control, adjustment of the mA and/or kV according to patient size and/or use of iterative reconstruction technique. COMPARISON:  None Available. FINDINGS: Lower chest: No acute abnormality. Hepatobiliary: No focal liver abnormality. No gallstones, gallbladder wall thickening, or pericholecystic fluid. No biliary dilatation. Pancreas: No focal lesion. Normal pancreatic contour. No surrounding inflammatory changes. No main pancreatic ductal dilatation. Spleen: Normal in size without focal abnormality. Adrenals/Urinary Tract: No adrenal nodule bilaterally. Nonspecific bilateral mild perinephric fat stranding. No nephrolithiasis and no hydronephrosis. No definite contour-deforming renal mass. No ureterolithiasis or hydroureter. The urinary bladder is unremarkable. Stomach/Bowel: Stomach is within normal limits. No evidence of bowel wall thickening or dilatation. The appendix is not definitely identified with no inflammatory changes in the right lower quadrant to suggest acute appendicitis. Vascular/Lymphatic: No abdominal aorta or iliac aneurysm. Moderate atherosclerotic plaque of the aorta and its branches. No abdominal, pelvic, or inguinal lymphadenopathy. Reproductive: Prostate is unremarkable. Other: No intraperitoneal free fluid. No intraperitoneal free gas. No organized fluid collection. Musculoskeletal: No abdominal wall hernia or abnormality. No suspicious lytic or blastic osseous lesions. No acute displaced fracture. Multilevel degenerative changes of the spine. IMPRESSION: 1. No acute intra-abdominal or intrapelvic abnormality with  limited evaluation on this noncontrast study. 2.  Aortic Atherosclerosis (ICD10-I70.0). Electronically Signed   By: Morgane  Naveau M.D.   On: 06/29/2024 19:51   DG Chest Port 1 View Result Date: 06/29/2024 CLINICAL DATA:  Fatigue, fever, abdominal pain, and vomiting. EXAM: PORTABLE CHEST 1 VIEW COMPARISON:  02/13/2022. FINDINGS: The heart size and mediastinal contours are within normal limits. There is atherosclerotic calcification of the aorta. No consolidation, effusion, or pneumothorax is seen. No acute osseous abnormality. IMPRESSION: No active disease. Electronically Signed   By: Leita Birmingham M.D.   On: 06/29/2024 18:36      Assessment/Plan   Principal Problem:   Sepsis (HCC)   ***            ***                  ***                   ***                  ***                  ***                  ***                   ***                  ***                  ***                  ***                  ***                 ***                ***  DVT prophylaxis: SCD's ***  Code Status: Full code*** Family Communication: none*** Disposition Plan: Per Rounding Team Consults called: none***;  Admission status: ***     I SPENT GREATER THAN 75 *** MINUTES IN CLINICAL CARE TIME/MEDICAL DECISION-MAKING IN COMPLETING THIS ADMISSION.      Eva NOVAK Islam Villescas DO [facility]  From 7PM - 7AM   06/29/2024, 10:33 PM   ***

## 2024-06-29 NOTE — ED Notes (Signed)
 Ginger Ale and peanut cracker  was offered to the family  at the bedside.

## 2024-06-29 NOTE — ED Triage Notes (Signed)
 Arrives POV with complaints of fever (high of 102F), fatigue, abdominal pain and vomiting x3 days. Rates abdominal pain a 10/10.

## 2024-06-29 NOTE — Sepsis Progress Note (Signed)
Elink following code sepis ?

## 2024-06-29 NOTE — ED Provider Notes (Signed)
 [place] EMERGENCY DEPARTMENT AT [facility] Provider Note   CSN: [id number] Arrival date & time: 06/29/24  1721    Patient presents with: Fever, Abdominal Pain, and Emesis   Raymond Cox is a 63 y.o. male here for evaluation of fever abdominal pain and dysuria.  Symptoms started 3 to 4 days ago.  He has had gradually increased generalized weakness.  Temp as high as 102 at home, doing DayQuil and NyQuil.  He has some dysuria frequency and urgency.  He has some pain to his bilateral flanks and suprapubic region.  Some nausea with an episode of NBNB emesis.  No headache, neck pain, sore throat, congestion, chest pain, shortness of breath, loose stool, bloody stool.  No sick contacts.   HPI     Prior to Admission medications   Medication Sig Start Date End Date Taking? Authorizing Provider  acetaminophen  (TYLENOL ) 500 MG tablet Take 500 mg by mouth daily as needed for fever or headache (pain).    [provider]  aspirin 81 MG chewable tablet Chew 81 mg by mouth daily. Patient not taking: Reported on 04/15/2022    [provider]  Capsaicin 0.1 % CREA Apply 1 application. topically 2 (two) times daily as needed (pain).    [provider]  Cholecalciferol  (VITAMIN D3 PO) Take 1 capsule by mouth daily. Patient not taking: Reported on 04/16/2022    [provider]  diltiazem  (DILACOR XR ) 240 MG 24 hr capsule Take 240 mg by mouth daily. When able to remember    [provider]  divalproex  (DEPAKOTE ) 500 MG DR tablet Take 2 tablets (1,000 mg total) by mouth every 12 (twelve) hours. Patient taking differently: Take 1,000 mg by mouth daily. When able to remember 05/03/19 04/16/22  Rockey Jolly A, DO  feeding supplement, GLUCERNA SHAKE, (GLUCERNA SHAKE) LIQD Take 1 Container by mouth See admin instructions. Drink 1 container 1-3 times a day    [provider]  hydrALAZINE  (APRESOLINE ) 50 MG tablet Take 50 mg by mouth in the morning and at  bedtime. When able to remember    [provider]  levETIRAcetam  (KEPPRA ) 1000 MG tablet Take 1 tablet (1,000 mg total) by mouth 2 (two) times daily. Patient taking differently: Take 1,000 mg by mouth daily. When able to remember 05/03/19 04/15/22  Rockey Jolly A, DO  Oxcarbazepine  (TRILEPTAL ) 600 MG tablet Take 1 tablet (600 mg total) by mouth 2 (two) times daily. Patient taking differently: Take 600 mg by mouth daily. When able to remember 05/03/19 04/16/22  Kyle, Tyrone A, DO  Phenyleph-Triprolidine-DM-APAP (MUCINEX NIGHTSHIFT SINUS PO) Take 10 mLs by mouth daily as needed (fever/cough).    [provider]  sildenafil (VIAGRA) 100 MG tablet Take 50 mg by mouth See admin instructions. Take 50 mg by mouth 1 hour prior to sexual activity. Do not exceed 1 dose per 24 hours    [provider]  SUMAtriptan (IMITREX) 50 MG tablet Take 50 mg by mouth See admin instructions. Take 50 mg by mouth at the onset of headache, then repeat in 2 hours if headache persists or recurs. No more than 200 mg a day    [provider]    Allergies: Patient has no known allergies.    Review of Systems  Constitutional:  Positive for fever.  HENT: Negative.    Respiratory: Negative.    Cardiovascular: Negative.   Gastrointestinal:  Positive for abdominal pain, nausea and vomiting. Negative for constipation and diarrhea.  Genitourinary:  Positive for dysuria, flank pain, frequency and urgency. Negative for difficulty urinating, enuresis, hematuria, scrotal swelling and testicular pain.  Neurological: Negative.   All other systems reviewed and are negative.   Updated Vital Signs BP 138/74   Pulse 80   Temp 100 F (37.8 C) (Oral)   Resp 16   Ht 6' 2 (1.88 m)   Wt 93 kg   SpO2 95%   BMI 26.32 kg/m   Physical Exam Vitals and nursing note reviewed.  Constitutional:      General: He is not in acute distress.    Appearance: He is well-developed. He is ill-appearing. He is not  toxic-appearing or diaphoretic.  HENT:     Head: Atraumatic.     Jaw: There is normal jaw occlusion.     Mouth/Throat:     Mouth: Mucous membranes are dry.  Eyes:     Pupils: Pupils are equal, round, and reactive to light.     Comments: No nystagmus  Neck:     Trachea: Trachea and phonation normal.     Comments: No neck stiffness or neck rigidity, no midline tenderness Cardiovascular:     Rate and Rhythm: Regular rhythm. Tachycardia present.     Pulses: Normal pulses.          Radial pulses are 2+ on the right side and 2+ on the left side.       Dorsalis pedis pulses are 2+ on the right side and 2+ on the left side.     Heart sounds: Normal heart sounds.  Pulmonary:     Effort: Pulmonary effort is normal. No respiratory distress.     Breath sounds: Normal breath sounds and air entry.     Comments: Clear bilaterally, speaks in full sentences without difficulty Chest:     Comments: Nontender chest wall, no crepitus or step-off Abdominal:     General: Bowel sounds are normal. There is no distension.     Palpations: Abdomen is soft.     Tenderness: There is abdominal tenderness in the suprapubic area. There is right CVA tenderness and left CVA tenderness. There is no guarding or rebound. Negative signs include Murphy's sign and McBurney's sign.     Comments: Soft, diffusely tender to suprapubic region, positive CVA tap bilaterally  Musculoskeletal:        General: Normal range of motion.     Cervical back: Full passive range of motion without pain, normal range of motion and neck supple.     Comments: No bony tenderness, compartments soft, full range of motion  Skin:    General: Skin is warm and dry.     Capillary Refill: Capillary refill takes less than 2 seconds.     Comments: No obvious rashes or lesions on exposed skin  Neurological:     General: No focal deficit present.     Mental Status: He is alert and oriented to person, place, and time.     Cranial Nerves: Cranial nerves  2-12 are intact.     Sensory: Sensation is intact.     Motor: Motor function is intact.     Gait: Gait is intact.     (all labs ordered are listed, but only abnormal results are displayed) Labs Reviewed  COMPREHENSIVE METABOLIC PANEL WITH GFR - Abnormal; Notable for the following components:      Result Value   CO2 19 (*)    Glucose, Bld 123 (*)    BUN 31 (*)    Creatinine, Ser  2.13 (*)    AST 127 (*)    ALT 175 (*)    Alkaline Phosphatase 176 (*)    GFR, Estimated 34 (*)    Anion gap 17 (*)    All other components within normal limits  CBC WITH DIFFERENTIAL/PLATELET - Abnormal; Notable for the following components:   WBC 17.6 (*)    Neutro Abs 12.8 (*)    Monocytes Absolute 3.5 (*)    Abs Immature Granulocytes 0.09 (*)    All other components within normal limits  URINALYSIS, W/ REFLEX TO CULTURE (INFECTION SUSPECTED) - Abnormal; Notable for the following components:   APPearance HAZY (*)    Hgb urine dipstick MODERATE (*)    Protein, ur 30 (*)    Leukocytes,Ua LARGE (*)    All other components within normal limits  RESP PANEL BY RT-PCR (RSV, FLU A&B, COVID)  RVPGX2  CULTURE, BLOOD (ROUTINE X 2)  CULTURE, BLOOD (ROUTINE X 2)  URINE CULTURE  LACTIC ACID, PLASMA  LACTIC ACID, PLASMA    EKG: EKG Interpretation Date/Time:  Saturday June 29 2024 18:03:35 EDT Ventricular Rate:  93 PR Interval:  111 QRS Duration:  68 QT Interval:  305 QTC Calculation: 380 R Axis:   53  Text Interpretation: Sinus rhythm Borderline short PR interval Consider right atrial enlargement Abnormal R-wave progression, early transition Minimal ST depression, inferior leads Minimal ST elevation, anterior leads Confirmed by Mannie Pac [phone]) on 06/29/2024 6:08:06 PM  Radiology: CT ABDOMEN PELVIS WO CONTRAST Result Date: 06/29/2024 CLINICAL DATA:  Abdominal pain, acute, nonlocalized abd pain, sepsis. Fever. Fatigue, vomiting abdominal pain EXAM: CT ABDOMEN AND PELVIS WITHOUT CONTRAST  TECHNIQUE: Multidetector CT imaging of the abdomen and pelvis was performed following the standard protocol without IV contrast. RADIATION DOSE REDUCTION: This exam was performed according to the departmental dose-optimization program which includes automated exposure control, adjustment of the mA and/or kV according to patient size and/or use of iterative reconstruction technique. COMPARISON:  None Available. FINDINGS: Lower chest: No acute abnormality. Hepatobiliary: No focal liver abnormality. No gallstones, gallbladder wall thickening, or pericholecystic fluid. No biliary dilatation. Pancreas: No focal lesion. Normal pancreatic contour. No surrounding inflammatory changes. No main pancreatic ductal dilatation. Spleen: Normal in size without focal abnormality. Adrenals/Urinary Tract: No adrenal nodule bilaterally. Nonspecific bilateral mild perinephric fat stranding. No nephrolithiasis and no hydronephrosis. No definite contour-deforming renal mass. No ureterolithiasis or hydroureter. The urinary bladder is unremarkable. Stomach/Bowel: Stomach is within normal limits. No evidence of bowel wall thickening or dilatation. The appendix is not definitely identified with no inflammatory changes in the right lower quadrant to suggest acute appendicitis. Vascular/Lymphatic: No abdominal aorta or iliac aneurysm. Moderate atherosclerotic plaque of the aorta and its branches. No abdominal, pelvic, or inguinal lymphadenopathy. Reproductive: Prostate is unremarkable. Other: No intraperitoneal free fluid. No intraperitoneal free gas. No organized fluid collection. Musculoskeletal: No abdominal wall hernia or abnormality. No suspicious lytic or blastic osseous lesions. No acute displaced fracture. Multilevel degenerative changes of the spine. IMPRESSION: 1. No acute intra-abdominal or intrapelvic abnormality with limited evaluation on this noncontrast study. 2.  Aortic Atherosclerosis (ICD10-I70.0). Electronically Signed   By:  Morgane  Naveau M.D.   On: 06/29/2024 19:51   DG Chest Port 1 View Result Date: 06/29/2024 CLINICAL DATA:  Fatigue, fever, abdominal pain, and vomiting. EXAM: PORTABLE CHEST 1 VIEW COMPARISON:  02/13/2022. FINDINGS: The heart size and mediastinal contours are within normal limits. There is atherosclerotic calcification of the aorta. No consolidation, effusion, or pneumothorax is seen. No acute  osseous abnormality. IMPRESSION: No active disease. Electronically Signed   By: Leita Birmingham M.D.   On: 06/29/2024 18:36     .Critical Care  Performed by: Edie Rosebud LABOR, PA-C Authorized by: Edie Rosebud LABOR, PA-C   Critical care provider statement:    Critical care time (minutes):  35   Critical care was necessary to treat or prevent imminent or life-threatening deterioration of the following conditions:  Sepsis and dehydration   Critical care was time spent personally by me on the following activities:  Development of treatment plan with patient or surrogate, discussions with consultants, evaluation of patient's response to treatment, examination of patient, ordering and review of laboratory studies, ordering and review of radiographic studies, ordering and performing treatments and interventions, pulse oximetry, re-evaluation of patient's condition and review of old charts    Medications Ordered in the ED  lactated ringers  infusion ( Intravenous New Bag/Given 06/29/24 1852)  sodium chloride  0.9 % bolus 1,000 mL (0 mLs Intravenous Stopped 06/29/24 1920)  ondansetron  (ZOFRAN ) injection 4 mg (4 mg Intravenous Given 06/29/24 1802)  morphine  (PF) 4 MG/ML injection 4 mg (4 mg Intravenous Given 06/29/24 1804)  acetaminophen  (TYLENOL ) tablet 1,000 mg (1,000 mg Oral Given 06/29/24 1808)  cefTRIAXone  (ROCEPHIN ) 2 g in sodium chloride  0.9 % 100 mL IVPB (0 g Intravenous Stopped 06/29/24 1857)  sodium chloride  0.9 % bolus 1,000 mL (0 mLs Intravenous Stopped 06/29/24 1920)    63 here for evaluation weakness fever,  abdominal pain and urinary symptoms.  Symptoms started 3 to 4 days ago.  On arrival he is febrile, tachycardic, tachypneic.  Code sepsis called.  Suspect urinary source.  Rocephin  was given.  Has some generalized weakness however no focal weakness.  Will plan on labs, imaging and reassess.  Labs and imaging personally viewed and interpreted:  CBC w/ leukocytosis CMP creatinine 2.13, baseline 1.4, elevated LFTs however has had similarly, anion gap 17 Lactic acid 1.3 COVID, flu, RSV negative Chest x-ray without acute abnormality EKG without ischemic changes  Patient reassessed.  Has positive CVA tap bilaterally left greater than right, and urinary symptoms he will be treated for pyelonephritis.  CT imaging without acute abnormality  Will admit for sepsis likely due to pyelonephritis, AKI  Discussed with Dr. Awanda with medicine he is agreeable to accept patient in transfer.  I discussed plan with patient and family in room.  Agreeable.  The patient appears reasonably stabilized for admission considering the current resources, flow, and capabilities available in the ED at this time, and I doubt any other [facility] requiring further screening and/or treatment in the ED prior to admission.     Clinical Course as of 06/29/24 2146  Sat Jun 29, 2024  2107 Dr. Awanda with medicine agreeable to accept patient in transfer for admission [BH]    Clinical Course User Index [BH] Torsten Weniger A, PA-C                                 Medical Decision Making Amount and/or Complexity of Data Reviewed External Data Reviewed: labs, radiology, ECG and notes. Labs: ordered. Decision-making details documented in ED Course. Radiology: ordered and independent interpretation performed. Decision-making details documented in ED Course. ECG/medicine tests: ordered and independent interpretation performed. Decision-making details documented in ED Course.  Risk OTC drugs. Prescription drug management. Parenteral  controlled substances. Decision regarding hospitalization. Diagnosis or treatment significantly limited by social determinants of health.  Final diagnoses:  Sepsis with acute renal failure without septic shock, due to unspecified organism, unspecified acute renal failure type (HCC)  Weakness  Elevated LFTs  Acute cystitis with hematuria  Pyelonephritis    ED Discharge Orders     None          Kyerra Vargo A, PA-C 06/29/24 2147    Mannie Pac T, DO 06/30/24 1533

## 2024-06-30 DIAGNOSIS — R7401 Elevation of levels of liver transaminase levels: Secondary | ICD-10-CM | POA: Diagnosis present

## 2024-06-30 DIAGNOSIS — R531 Weakness: Secondary | ICD-10-CM

## 2024-06-30 DIAGNOSIS — Z8679 Personal history of other diseases of the circulatory system: Secondary | ICD-10-CM

## 2024-06-30 DIAGNOSIS — A419 Sepsis, unspecified organism: Secondary | ICD-10-CM | POA: Diagnosis not present

## 2024-06-30 DIAGNOSIS — R6520 Severe sepsis without septic shock: Secondary | ICD-10-CM | POA: Diagnosis not present

## 2024-06-30 DIAGNOSIS — E8729 Other acidosis: Secondary | ICD-10-CM | POA: Diagnosis present

## 2024-06-30 DIAGNOSIS — N3000 Acute cystitis without hematuria: Secondary | ICD-10-CM | POA: Diagnosis present

## 2024-06-30 DIAGNOSIS — Z87898 Personal history of other specified conditions: Secondary | ICD-10-CM

## 2024-06-30 LAB — BLOOD CULTURE ID PANEL (REFLEXED) - BCID2

## 2024-06-30 LAB — CBC WITH DIFFERENTIAL/PLATELET
Abs Immature Granulocytes: 0.14 K/uL — ABNORMAL HIGH (ref 0.00–0.07)
Basophils Absolute: 0 K/uL (ref 0.0–0.1)
Basophils Relative: 0 %
Eosinophils Absolute: 0 K/uL (ref 0.0–0.5)
Eosinophils Relative: 0 %
HCT: 39.5 % (ref 39.0–52.0)
Hemoglobin: 12.8 g/dL — ABNORMAL LOW (ref 13.0–17.0)
Immature Granulocytes: 1 %
Lymphocytes Relative: 7 %
Lymphs Abs: 1.1 K/uL (ref 0.7–4.0)
MCH: 31.4 pg (ref 26.0–34.0)
MCHC: 32.4 g/dL (ref 30.0–36.0)
MCV: 96.8 fL (ref 80.0–100.0)
Monocytes Absolute: 3 K/uL — ABNORMAL HIGH (ref 0.1–1.0)
Monocytes Relative: 20 %
Neutro Abs: 11.1 K/uL — ABNORMAL HIGH (ref 1.7–7.7)
Neutrophils Relative %: 72 %
Platelets: 155 K/uL (ref 150–400)
RBC: 4.08 MIL/uL — ABNORMAL LOW (ref 4.22–5.81)
RDW: 13.4 % (ref 11.5–15.5)
WBC: 15.3 K/uL — ABNORMAL HIGH (ref 4.0–10.5)
nRBC: 0 % (ref 0.0–0.2)

## 2024-06-30 LAB — COMPREHENSIVE METABOLIC PANEL WITH GFR
ALT: 124 U/L — ABNORMAL HIGH (ref 0–44)
AST: 72 U/L — ABNORMAL HIGH (ref 15–41)
Albumin: 2.5 g/dL — ABNORMAL LOW (ref 3.5–5.0)
Alkaline Phosphatase: 104 U/L (ref 38–126)
Anion gap: 9 (ref 5–15)
BUN: 27 mg/dL — ABNORMAL HIGH (ref 8–23)
CO2: 23 mmol/L (ref 22–32)
Calcium: 8.1 mg/dL — ABNORMAL LOW (ref 8.9–10.3)
Chloride: 110 mmol/L (ref 98–111)
Creatinine, Ser: 1.87 mg/dL — ABNORMAL HIGH (ref 0.61–1.24)
GFR, Estimated: 39 mL/min — ABNORMAL LOW (ref >60)
Glucose, Bld: 117 mg/dL — ABNORMAL HIGH (ref 70–99)
Potassium: 4.3 mmol/L (ref 3.5–5.1)
Sodium: 142 mmol/L (ref 135–145)
Total Bilirubin: 0.8 mg/dL (ref 0.0–1.2)
Total Protein: 6 g/dL — ABNORMAL LOW (ref 6.5–8.1)

## 2024-06-30 LAB — PROTIME-INR
INR: 1.2 (ref 0.8–1.2)
Prothrombin Time: 15.9 s — ABNORMAL HIGH (ref 11.4–15.2)

## 2024-06-30 LAB — VITAMIN B12: Vitamin B-12: 572 pg/mL (ref 180–914)

## 2024-06-30 LAB — FOLATE: Folate: 13.3 ng/mL (ref >5.9)

## 2024-06-30 LAB — CK: Total CK: 212 U/L (ref 49–397)

## 2024-06-30 LAB — LACTIC ACID, PLASMA
Lactic Acid, Venous: 1.1 mmol/L (ref 0.5–1.9)
Lactic Acid, Venous: 0.9 mmol/L (ref 0.5–1.9)

## 2024-06-30 LAB — VITAMIN D 25 HYDROXY (VIT D DEFICIENCY, FRACTURES): Vit D, 25-Hydroxy: 31.6 ng/mL (ref 30–100)

## 2024-06-30 LAB — MAGNESIUM: Magnesium: 2 mg/dL (ref 1.7–2.4)

## 2024-06-30 LAB — TSH: TSH: 1.626 u[IU]/mL (ref 0.350–4.500)

## 2024-06-30 MED ORDER — HYDRALAZINE HCL 50 MG PO TABS
50.0000 mg | ORAL_TABLET | Freq: Two times a day (BID) | ORAL | Status: DC
  Administered 2024-06-30 – 2024-07-03 (×12): 50 mg via ORAL
  Filled 2024-06-30 (×7): qty 1

## 2024-06-30 MED ORDER — ENSURE PLUS HIGH PROTEIN PO LIQD
237.0000 mL | Freq: Three times a day (TID) | ORAL | Status: DC
  Administered 2024-06-30 – 2024-07-03 (×8): 237 mL via ORAL

## 2024-06-30 MED ORDER — SODIUM CHLORIDE 0.9 % IV SOLN
2.0000 g | INTRAVENOUS | Status: DC
  Administered 2024-06-30 – 2024-07-01 (×3): 2 g via INTRAVENOUS
  Filled 2024-06-30 (×2): qty 20

## 2024-06-30 MED ORDER — HYDROMORPHONE HCL 1 MG/ML IJ SOLN
1.0000 mg | INTRAMUSCULAR | Status: DC | PRN
  Administered 2024-06-30 – 2024-07-02 (×5): 1 mg via INTRAVENOUS
  Filled 2024-06-30 (×4): qty 1

## 2024-06-30 MED ORDER — OXCARBAZEPINE 300 MG PO TABS
600.0000 mg | ORAL_TABLET | Freq: Two times a day (BID) | ORAL | Status: DC
  Administered 2024-06-30 – 2024-07-03 (×12): 600 mg via ORAL
  Filled 2024-06-30 (×7): qty 2

## 2024-06-30 MED ORDER — ENOXAPARIN SODIUM 40 MG/0.4ML IJ SOSY
40.0000 mg | PREFILLED_SYRINGE | Freq: Every evening | INTRAMUSCULAR | Status: DC
  Administered 2024-06-30 – 2024-07-02 (×5): 40 mg via SUBCUTANEOUS
  Filled 2024-06-30 (×2): qty 0.4

## 2024-06-30 MED ORDER — LACTATED RINGERS IV SOLN: INTRAVENOUS | Status: AC

## 2024-06-30 MED ORDER — LEVETIRACETAM 500 MG PO TABS
1000.0000 mg | ORAL_TABLET | Freq: Two times a day (BID) | ORAL | Status: DC
  Administered 2024-06-30 – 2024-07-03 (×12): 1000 mg via ORAL
  Filled 2024-06-30 (×7): qty 2

## 2024-06-30 MED ORDER — DIVALPROEX SODIUM 500 MG PO DR TAB
1000.0000 mg | DELAYED_RELEASE_TABLET | Freq: Two times a day (BID) | ORAL | Status: DC
Start: 2024-06-30 — End: 2024-07-03
  Administered 2024-06-30 – 2024-07-03 (×12): 1000 mg via ORAL
  Filled 2024-06-30 (×7): qty 2

## 2024-06-30 NOTE — Evaluation (Signed)
 Occupational Therapy Evaluation Patient Details Name: Raymond Cox MRN: [id number] DOB: 1961/02/27 Today's Date: 06/30/2024   History of Present Illness   Pt is a 63 y/o male presenting on 8/9 with fever. Admitted with sepsis due to suspected acute cystitis, AKI superimposed on CKD 3B. PMH includes: CAD, HTN, renal disorder, seizures.     Clinical Impressions PTA patient independent with ADLs and mobility using cane, reports driving occasionally.  Admitted for above and presents with problem list below.  He reports mobilizing to bathroom carefully prior to OT entering, and describes feeing diaphoretic after +BM.  Pt transitioned to EOB with OT given contact guard to close supervision, but due to increased fatigue patient request to return back to bed to rest.  Anticipate needing contact guard to min assist for ADLs. Limited eval but based performance today, recommend HHOT services at dc to optimize return to PLOF with ADLs and mobility, increase activity tolerance.  Will follow acutely.      If plan is discharge home, recommend the following:   A little help with walking and/or transfers;A little help with bathing/dressing/bathroom;Assistance with cooking/housework;Assist for transportation;Help with stairs or ramp for entrance     Functional Status Assessment   Patient has had a recent decline in their functional status and demonstrates the ability to make significant improvements in function in a reasonable and predictable amount of time.     Equipment Recommendations   BSC/3in1     Recommendations for Other Services         Precautions/Restrictions   Precautions Precautions: Fall Recall of Precautions/Restrictions: Intact Restrictions Weight Bearing Restrictions Per Provider Order: No     Mobility Bed Mobility Overal bed mobility: Needs Assistance Bed Mobility: Supine to Sit, Sit to Supine     Supine to sit: Contact guard Sit to supine: Supervision         Transfers                   General transfer comment: deferred d/t fatigue, pt requesting to lay back down after sitting at EOB for > 5 minutes      Balance Overall balance assessment: Mild deficits observed, not formally tested                                         ADL either performed or assessed with clinical judgement   ADL Overall ADL's : Needs assistance/impaired     Grooming: Set up;Sitting           Upper Body Dressing : Set up;Sitting   Lower Body Dressing: Minimal assistance;Sitting/lateral leans     Toilet Transfer Details (indicate cue type and reason): deferred d/t fatigue, pt reports ambulating carefully to bathroom prior to OT entering room         Functional mobility during ADLs: Contact guard assist General ADL Comments: limited to EOB due to fatigue, pt reports ambulating to bathroom carefully and feeling hot and sweaty after +BM.  Spouse at side and supportive.  Educated on getting up with assist next time for safety.     Vision Patient Visual Report: No change from baseline Vision Assessment?: No apparent visual deficits     Perception         Praxis         Pertinent Vitals/Pain Pain Assessment Pain Assessment: No/denies pain     Extremity/Trunk Assessment Upper Extremity Assessment Upper  Extremity Assessment: Generalized weakness   Lower Extremity Assessment Lower Extremity Assessment: Defer to PT evaluation   Cervical / Trunk Assessment Cervical / Trunk Assessment: Normal   Communication Communication Communication: No apparent difficulties   Cognition Arousal: Alert Behavior During Therapy: Flat affect               OT - Cognition Comments: pt oriented and following commands, able to answer questions about PLOF but limited assessment due to generalized fatigue.                 Following commands: Intact       Cueing  General Comments   Cueing Techniques: Verbal  cues  spouse at side and supportive   Exercises     Shoulder Instructions      Home Living Family/patient expects to be discharged to:: Private residence Living Arrangements: Spouse/significant other Available Help at Discharge: Family;Available 24 hours/day Type of Home: House Home Access: Stairs to enter [employer] of Steps: 1   Home Layout: Two level;Able to live on main level with bedroom/bathroom     Bathroom Shower/Tub: Producer, television/film/video: Standard Bathroom Accessibility: Yes   Home Equipment: Cane - single point;Shower seat - built in;Grab bars - tub/shower          Prior Functioning/Environment Prior Level of Function : Independent/Modified Independent;Driving             Mobility Comments: using cane for mobility ADLs Comments: reports indepednent, occasionally drives    OT Problem List: Decreased strength;Decreased activity tolerance;Impaired balance (sitting and/or standing);Decreased knowledge of precautions;Decreased knowledge of use of DME or AE   OT Treatment/Interventions: Self-care/ADL training;Therapeutic exercise;DME and/or AE instruction;Energy conservation;Therapeutic activities;Cognitive remediation/compensation;Patient/family education;Balance training      OT Goals(Current goals can be found in the care plan section)   Acute Rehab OT Goals Patient Stated Goal: get better OT Goal Formulation: With patient Time For Goal Achievement: 07/14/24 Potential to Achieve Goals: Good   OT Frequency:  Min 2X/week    Co-evaluation              AM-PAC OT 6 Clicks Daily Activity     Outcome Measure Help from another person eating meals?: A Little Help from another person taking care of personal grooming?: A Little Help from another person toileting, which includes using toliet, bedpan, or urinal?: A Little Help from another person bathing (including washing, rinsing, drying)?: A Little Help from another person  to put on and taking off regular upper body clothing?: A Little Help from another person to put on and taking off regular lower body clothing?: A Little 6 Click Score: 18   End of Session Nurse Communication: Mobility status  Activity Tolerance: Patient limited by fatigue Patient left: in bed;with call bell/phone within reach;with bed alarm set;with family/visitor present  OT Visit Diagnosis: Other abnormalities of gait and mobility (R26.89);Muscle weakness (generalized) (M62.81)                Time: 8980-8960 OT Time Calculation (min): 20 min Charges:  OT General Charges $OT Visit: 1 Visit OT Evaluation $OT Eval Moderate Complexity: 1 Mod  Etta NOVAK, OT Acute Rehabilitation Services Office [phone] Secure Chat Preferred    Etta GORMAN Hope 06/30/2024, 10:52 AM

## 2024-06-30 NOTE — Plan of Care (Signed)
   Problem: Activity: Goal: Risk for activity intolerance will decrease Outcome: Progressing   Problem: Elimination: Goal: Will not experience complications related to bowel motility Outcome: Progressing   Problem: Safety: Goal: Ability to remain free from injury will improve Outcome: Progressing   Problem: Skin Integrity: Goal: Risk for impaired skin integrity will decrease Outcome: Progressing

## 2024-06-30 NOTE — Plan of Care (Signed)

## 2024-06-30 NOTE — Evaluation (Signed)
 Physical Therapy Evaluation Patient Details Name: Raymond Cox MRN: [id number] DOB: 04-02-1961 Today's Date: 06/30/2024  History of Present Illness  63 y.o. male presents to [facility] on 8/9 from [facility] ED with severe sepsis 2/2 acute cystitis. Also with AKI superimposed on CKD 3B. PMH includes: CAD, HTN, renal disorder, seizures.  Clinical Impression  Pt in bed upon arrival with wife present and agreeable to PT eval. PTA, pt was ModI with SP cane for mobility. Upon arrival, pt was feeling lightheaded and slightly nauseous with stomach pain. Supervision/CGA for all mobility with no AD. When standing for BP readings, noted anterior/posterior sway with pt unable to vocalize if symptoms were worse. Able to ambulate to/from the bathroom with slight unsteadiness after having a BM. Pt then reaching for UE support, however, no physical assist required to maintain balance. Anticipate that pt will progress well with continued mobility and management of symptoms. Initially recommending HHPT with acute PT to continue to follow and monitor for needs. Pt will have 24/7 assist as needed upon return home. Pt would benefit from acute skilled PT with current functional limitations listed below (see PT Problem List). Acute PT to follow.  BP Readings: Supine: 155/74, 78 BPM Sitting: 136/73, 83 BPM Standing: 134/74, 90 BPM Standing 3 min: 142/70, 88 BPM         If plan is discharge home, recommend the following: A little help with walking and/or transfers;A little help with bathing/dressing/bathroom;Assist for transportation;Help with stairs or ramp for entrance   Can travel by private vehicle    Yes    Equipment Recommendations None recommended by PT     Functional Status Assessment Patient has had a recent decline in their functional status and demonstrates the ability to make significant improvements in function in a reasonable and predictable amount of time.     Precautions / Restrictions  Precautions Precautions: Fall Recall of Precautions/Restrictions: Intact Restrictions Weight Bearing Restrictions Per Provider Order: No      Mobility  Bed Mobility Overal bed mobility: Needs Assistance Bed Mobility: Supine to Sit, Sit to Supine     Supine to sit: Supervision Sit to supine: Supervision   General bed mobility comments: for safety    Transfers Overall transfer level: Needs assistance Equipment used: None Transfers: Sit to/from Stand Sit to Stand: Contact guard assist    General transfer comment: CGA for safety, anterior/posterior swaying noticed during orthostatic BP testing    Ambulation/Gait Ambulation/Gait assistance: Contact guard assist Gait Distance (Feet): 30 Feet Assistive device: None Gait Pattern/deviations: Step-through pattern, Decreased stride length, Trunk flexed Gait velocity: decr     General Gait Details: forward flexed trunk with slight unsteadiness after returning from bathroom. Pt then reaching for UE support with pt unable to report if symptoms were worse (lightheadedness w/ stomach discomfort throughout eval)    Balance Overall balance assessment: Mild deficits observed, not formally tested       Pertinent Vitals/Pain Pain Assessment Pain Assessment: No/denies pain    Home Living Family/patient expects to be discharged to:: Private residence Living Arrangements: Spouse/significant other Available Help at Discharge: Family;Available 24 hours/day Type of Home: House Home Access: Stairs to enter   [employer] of Steps: 1   Home Layout: Two level;Able to live on main level with bedroom/bathroom Home Equipment: [place] - single point;Shower seat - built in;Grab bars - tub/shower      Prior Function Prior Level of Function : Independent/Modified Independent;Driving  Mobility Comments: using cane for mobility ADLs Comments: reports indepednent, occasionally  drives     Extremity/Trunk Assessment   Upper Extremity  Assessment Upper Extremity Assessment: Defer to OT evaluation    Lower Extremity Assessment Lower Extremity Assessment: Generalized weakness    Cervical / Trunk Assessment Cervical / Trunk Assessment: Normal  Communication   Communication Communication: No apparent difficulties    Cognition Arousal: Alert Behavior During Therapy: Flat affect   PT - Cognitive impairments: No apparent impairments    Following commands: Intact       Cueing Cueing Techniques: Verbal cues     General Comments General comments (skin integrity, edema, etc.): Spouse present during session and supportive     PT Assessment Patient needs continued PT services  PT Problem List Decreased strength;Decreased activity tolerance;Decreased balance;Decreased mobility       PT Treatment Interventions DME instruction;Gait training;Stair training;Functional mobility training;Therapeutic activities;Therapeutic exercise;Balance training;Neuromuscular re-education;Patient/family education    PT Goals (Current goals can be found in the Care Plan section)  Acute Rehab PT Goals Patient Stated Goal: to feel better PT Goal Formulation: With patient/family Time For Goal Achievement: 07/14/24 Potential to Achieve Goals: Good    Frequency Min 2X/week        AM-PAC PT 6 Clicks Mobility  Outcome Measure Help needed turning from your back to your side while in a flat bed without using bedrails?: A Little Help needed moving from lying on your back to sitting on the side of a flat bed without using bedrails?: A Little Help needed moving to and from a bed to a chair (including a wheelchair)?: A Little Help needed standing up from a chair using your arms (e.g., wheelchair or bedside chair)?: A Little Help needed to walk in hospital room?: A Little Help needed climbing 3-5 steps with a railing? : A Little 6 Click Score: 18    End of Session   Activity Tolerance: Other (comment) (limited by feeling  lightheaded) Patient left: in bed;with call bell/phone within reach;with family/visitor present;with nursing/sitter in room Nurse Communication: Mobility status;Patient requests pain meds PT Visit Diagnosis: Other abnormalities of gait and mobility (R26.89);Muscle weakness (generalized) (M62.81)    Time: 8540-8476 PT Time Calculation (min) (ACUTE ONLY): 24 min   Charges:   PT Evaluation $PT Eval Low Complexity: 1 Low   PT General Charges $$ ACUTE PT VISIT: 1 Visit       Kate ORN, PT, DPT Secure Chat Preferred  Rehab Office [phone]   Kate BRAVO Wendolyn 06/30/2024, 3:28 PM

## 2024-06-30 NOTE — Progress Notes (Signed)
 [facility] Progress Note  Patient: Raymond Cox    FMW:[id number]  DOA: 06/29/2024     Date of Service: the patient was seen and examined on 06/30/2024  Chief Complaint  Patient presents with   Fever   Abdominal Pain   Emesis   Brief hospital course: Raymond Cox is a 63 y.o. male with medical history significant for essential hypertension, CKD 3B with baseline creatinine 1.4-1.8, seizures, who is admitted to [facility] on 06/29/2024 by way of transfer from [facility] with severe sepsis due to acute cystitis after presenting from home to the latter facility complaining of fever for 3-4 days associated with chills in the absence of whole body rigors or generalized myalgias, dysuria and urinary frequency.  [facility] was seen in [facility] and transferred to [facility] for admission and further management Patient was noticed to have severe sepsis due to UTI and developed acute kidney injury superimposed on CKD 3B as well as the anion gap metabolic acidosis.    Assessment and Plan:  # Severe sepsis secondary to UTI, E. coli bacteremia UA positive Continue ceftriaxone  2 g IV daily S/p IV fluid bolus given Continue IV fluid for hydration Blood culture 1/4 growing E. coli, follow sensitivity report Follow urine culture   # AKI on CKD stage IIIb sCr 2.13 on admission Creatinine 1.87 gradually improving Continue IV fluid for hydration Monitor renal function and urine output Avoid nephrotoxic medication, urinary dose medications  # Metabolic acidosis due to AKI on CKD stage IIIb Bicarb 19--23 Acidosis resolved Check BMP daily   # Chronic transaminitis Trend LFTs daily  # Hypertension Continue hydralazine  50 mg p.o. twice daily Monitor BP and titrate medication accordingly   # H/o seizure disorder Continue Keppra  1000 mg p.o. twice daily, Depakote  1000 mg p.o. twice daily, Trileptal  600 mg p.o. twice daily, home dose   # Generalized weakness secondary to  bacteremia B12 and vitamin D  level within normal limits Continue PT and OT    Body mass index is 24.74 kg/m.  Interventions:  Diet: Regular diet DVT Prophylaxis: Subcutaneous Lovenox    Advance goals of care discussion: Full code  Family Communication: family was present at bedside, at the time of interview.  The pt provided permission to discuss medical plan with the family. Opportunity was given to ask question and all questions were answered satisfactorily.   Disposition:  Pt is from Home, admitted with sepsis due to UTI, E. coli bacteremia, still on IV antibiotics, which precludes a safe discharge. Discharge to home, when stable most likely in 1 to 2 days.  Subjective: No significant events overnight, patient denies any pain.  Patient was feeling hot and sweating could be due to high temperature in the room, thermostat was at 75, lowered temperature and advised to keep the door cracked for ventilation.  Patient is still feeling generalized weakness, denied any other specific complaints.  Physical Exam: General: NAD, lying comfortably Appear in no distress, affect appropriate Eyes: PERRLA ENT: Oral Mucosa Clear, moist  Neck: no JVD,  Cardiovascular: S1 and S2 Present, no Murmur,  Respiratory: good respiratory effort, Bilateral Air entry equal and Decreased, no Crackles, no wheezes Abdomen: Bowel Sound present, Soft and no tenderness,  Skin: no rashes Extremities: no Pedal edema, no calf tenderness Neurologic: without any new focal findings Gait not checked due to patient safety concerns  Vitals:   06/30/24 0223 06/30/24 0510 06/30/24 0744 06/30/24 1156  BP: (!) 158/63 (!) 156/76 (!) 163/74 138/80  Pulse:  86 83 78  Resp:  18 16 17   Temp:  99.1 F (37.3 C) (!) 101.4 F (38.6 C) 100 F (37.8 C)  TempSrc:      SpO2:  94% 100% 100%  Weight:      Height:        Intake/Output Summary (Last 24 hours) at 06/30/2024 1320 Last data filed at 06/30/2024 0800 Gross per 24  hour  Intake 2456 ml  Output 750 ml  Net 1706 ml   Filed Weights   06/29/24 1734 06/29/24 2232  Weight: 93 kg 87.4 kg    Data Reviewed: I have personally reviewed and interpreted daily labs, tele strips, imagings as discussed above. I reviewed all nursing notes, pharmacy notes, vitals, pertinent old records I have discussed plan of care as described above with RN and patient/family.  CBC: Recent Labs  Lab 06/29/24 1746 06/30/24 0402  WBC 17.6* 15.3*  NEUTROABS 12.8* 11.1*  HGB 15.6 12.8*  HCT 46.8 39.5  MCV 95.1 96.8  PLT 161 155   Basic Metabolic Panel: Recent Labs  Lab 06/29/24 1746 06/30/24 0402  NA 139 142  K 4.2 4.3  CL 103 110  CO2 19* 23  GLUCOSE 123* 117*  BUN 31* 27*  CREATININE 2.13* 1.87*  CALCIUM 9.3 8.1*  MG  --  2.0    Studies: CT ABDOMEN PELVIS WO CONTRAST Result Date: 06/29/2024 CLINICAL DATA:  Abdominal pain, acute, nonlocalized abd pain, sepsis. Fever. Fatigue, vomiting abdominal pain EXAM: CT ABDOMEN AND PELVIS WITHOUT CONTRAST TECHNIQUE: Multidetector CT imaging of the abdomen and pelvis was performed following the standard protocol without IV contrast. RADIATION DOSE REDUCTION: This exam was performed according to the departmental dose-optimization program which includes automated exposure control, adjustment of the mA and/or kV according to patient size and/or use of iterative reconstruction technique. COMPARISON:  None Available. FINDINGS: Lower chest: No acute abnormality. Hepatobiliary: No focal liver abnormality. No gallstones, gallbladder wall thickening, or pericholecystic fluid. No biliary dilatation. Pancreas: No focal lesion. Normal pancreatic contour. No surrounding inflammatory changes. No main pancreatic ductal dilatation. Spleen: Normal in size without focal abnormality. Adrenals/Urinary Tract: No adrenal nodule bilaterally. Nonspecific bilateral mild perinephric fat stranding. No nephrolithiasis and no hydronephrosis. No definite  contour-deforming renal mass. No ureterolithiasis or hydroureter. The urinary bladder is unremarkable. Stomach/Bowel: Stomach is within normal limits. No evidence of bowel wall thickening or dilatation. The appendix is not definitely identified with no inflammatory changes in the right lower quadrant to suggest acute appendicitis. Vascular/Lymphatic: No abdominal aorta or iliac aneurysm. Moderate atherosclerotic plaque of the aorta and its branches. No abdominal, pelvic, or inguinal lymphadenopathy. Reproductive: Prostate is unremarkable. Other: No intraperitoneal free fluid. No intraperitoneal free gas. No organized fluid collection. Musculoskeletal: No abdominal wall hernia or abnormality. No suspicious lytic or blastic osseous lesions. No acute displaced fracture. Multilevel degenerative changes of the spine. IMPRESSION: 1. No acute intra-abdominal or intrapelvic abnormality with limited evaluation on this noncontrast study. 2.  Aortic Atherosclerosis (ICD10-I70.0). Electronically Signed   By: Morgane  Naveau M.D.   On: 06/29/2024 19:51   DG Chest Port 1 View Result Date: 06/29/2024 CLINICAL DATA:  Fatigue, fever, abdominal pain, and vomiting. EXAM: PORTABLE CHEST 1 VIEW COMPARISON:  02/13/2022. FINDINGS: The heart size and mediastinal contours are within normal limits. There is atherosclerotic calcification of the aorta. No consolidation, effusion, or pneumothorax is seen. No acute osseous abnormality. IMPRESSION: No active disease. Electronically Signed   By: Leita Birmingham M.D.   On: 06/29/2024 18:36  Scheduled Meds:  divalproex   1,000 mg Oral Q12H   hydrALAZINE   50 mg Oral BID   levETIRAcetam   1,000 mg Oral BID   oxcarbazepine   600 mg Oral BID   Continuous Infusions:  cefTRIAXone  (ROCEPHIN )  IV     lactated ringers  75 mL/hr at 06/30/24 0924   PRN Meds: acetaminophen  **OR** acetaminophen , HYDROmorphone  (DILAUDID ) injection, melatonin, naLOXone  (NARCAN )  injection, ondansetron  (ZOFRAN ) IV  Time  spent: 55 minutes  Author: ELVAN SOR. MD [facility] 06/30/2024 1:20 PM  To reach On-call, see care teams to locate the attending and reach out to them via [email or web address]. If 7PM-7AM, please contact night-coverage If you still have difficulty reaching the attending provider, please page the [facility] (Director on Call) for [facility] on amion for assistance.

## 2024-06-30 NOTE — Progress Notes (Addendum)
 PHARMACY - PHYSICIAN COMMUNICATION CRITICAL VALUE ALERT - BLOOD CULTURE IDENTIFICATION (BCID)  Raymond Cox is an 63 y.o. male who presented to [facility] on 06/29/2024 with a chief complaint of bacteremia.  Assessment:  Bcx 1/4 anaerobic only bottles. Potentially from urine given patient's initial acute cystitis on presentation.   Name of physician (or Provider) Contacted: Von Bellis MD  Current antibiotics: ceftriaxone  1 g IV q24h  Changes to prescribed antibiotics recommended:  Increase to ceftriaxone  2g IV q24h Recommendations accepted by provider  Results for orders placed or performed during the hospital encounter of 06/29/24  Blood Culture ID Panel (Reflexed) (Collected: 06/29/2024  5:52 PM)  Result Value Ref Range   Enterococcus faecalis NOT DETECTED NOT DETECTED   Enterococcus Faecium NOT DETECTED NOT DETECTED   Listeria monocytogenes NOT DETECTED NOT DETECTED   Staphylococcus species NOT DETECTED NOT DETECTED   Staphylococcus aureus (BCID) NOT DETECTED NOT DETECTED   Staphylococcus epidermidis NOT DETECTED NOT DETECTED   Staphylococcus lugdunensis NOT DETECTED NOT DETECTED   Streptococcus species NOT DETECTED NOT DETECTED   Streptococcus agalactiae NOT DETECTED NOT DETECTED   Streptococcus pneumoniae NOT DETECTED NOT DETECTED   Streptococcus pyogenes NOT DETECTED NOT DETECTED   A.calcoaceticus-baumannii NOT DETECTED NOT DETECTED   Bacteroides fragilis NOT DETECTED NOT DETECTED   Enterobacterales DETECTED (A) NOT DETECTED   Enterobacter cloacae complex NOT DETECTED NOT DETECTED   Escherichia coli DETECTED (A) NOT DETECTED   Klebsiella aerogenes NOT DETECTED NOT DETECTED   Klebsiella oxytoca NOT DETECTED NOT DETECTED   Klebsiella pneumoniae NOT DETECTED NOT DETECTED   Proteus species NOT DETECTED NOT DETECTED   Salmonella species NOT DETECTED NOT DETECTED   Serratia marcescens NOT DETECTED NOT DETECTED   Haemophilus influenzae NOT DETECTED NOT DETECTED   Neisseria  meningitidis NOT DETECTED NOT DETECTED   Pseudomonas aeruginosa NOT DETECTED NOT DETECTED   Stenotrophomonas maltophilia NOT DETECTED NOT DETECTED   Candida albicans NOT DETECTED NOT DETECTED   Candida auris NOT DETECTED NOT DETECTED   Candida glabrata NOT DETECTED NOT DETECTED   Candida krusei NOT DETECTED NOT DETECTED   Candida parapsilosis NOT DETECTED NOT DETECTED   Candida tropicalis NOT DETECTED NOT DETECTED   Cryptococcus neoformans/gattii NOT DETECTED NOT DETECTED   CTX-M ESBL NOT DETECTED NOT DETECTED   Carbapenem resistance IMP NOT DETECTED NOT DETECTED   Carbapenem resistance KPC NOT DETECTED NOT DETECTED   Carbapenem resistance NDM NOT DETECTED NOT DETECTED   Carbapenem resist OXA 48 LIKE NOT DETECTED NOT DETECTED   Carbapenem resistance VIM NOT DETECTED NOT DETECTED    Elma Fail, PharmD PGY1 Clinical Pharmacist [facility]  06/30/2024 8:31 AM

## 2024-07-01 DIAGNOSIS — A419 Sepsis, unspecified organism: Secondary | ICD-10-CM | POA: Diagnosis not present

## 2024-07-01 DIAGNOSIS — R6520 Severe sepsis without septic shock: Secondary | ICD-10-CM | POA: Diagnosis not present

## 2024-07-01 LAB — BASIC METABOLIC PANEL WITH GFR
Anion gap: 10 (ref 5–15)
BUN: 27 mg/dL — ABNORMAL HIGH (ref 8–23)
CO2: 23 mmol/L (ref 22–32)
Calcium: 8.4 mg/dL — ABNORMAL LOW (ref 8.9–10.3)
Chloride: 108 mmol/L (ref 98–111)
Creatinine, Ser: 1.69 mg/dL — ABNORMAL HIGH (ref 0.61–1.24)
GFR, Estimated: 44 mL/min — ABNORMAL LOW (ref >60)
Glucose, Bld: 107 mg/dL — ABNORMAL HIGH (ref 70–99)
Potassium: 4.1 mmol/L (ref 3.5–5.1)
Sodium: 141 mmol/L (ref 135–145)

## 2024-07-01 LAB — URINE CULTURE: Culture: NO GROWTH

## 2024-07-01 LAB — CBC
HCT: 41 % (ref 39.0–52.0)
Hemoglobin: 13.1 g/dL (ref 13.0–17.0)
MCH: 31 pg (ref 26.0–34.0)
MCHC: 32 g/dL (ref 30.0–36.0)
MCV: 96.9 fL (ref 80.0–100.0)
Platelets: 194 K/uL (ref 150–400)
RBC: 4.23 MIL/uL (ref 4.22–5.81)
RDW: 13.7 % (ref 11.5–15.5)
WBC: 13.8 K/uL — ABNORMAL HIGH (ref 4.0–10.5)
nRBC: 0 % (ref 0.0–0.2)

## 2024-07-01 LAB — VALPROIC ACID LEVEL: Valproic Acid Lvl: 47 ug/mL — ABNORMAL LOW (ref 50–100)

## 2024-07-01 LAB — PHOSPHORUS: Phosphorus: 4.1 mg/dL (ref 2.5–4.6)

## 2024-07-01 LAB — HEPATIC FUNCTION PANEL
ALT: 140 U/L — ABNORMAL HIGH (ref 0–44)
AST: 87 U/L — ABNORMAL HIGH (ref 15–41)
Albumin: 2.4 g/dL — ABNORMAL LOW (ref 3.5–5.0)
Alkaline Phosphatase: 115 U/L (ref 38–126)
Bilirubin, Direct: 0.2 mg/dL (ref 0.0–0.2)
Indirect Bilirubin: 0 mg/dL — ABNORMAL LOW (ref 0.3–0.9)
Total Bilirubin: 0.2 mg/dL (ref 0.0–1.2)
Total Protein: 6.1 g/dL — ABNORMAL LOW (ref 6.5–8.1)

## 2024-07-01 LAB — AMMONIA: Ammonia: 29 umol/L (ref 9–35)

## 2024-07-01 LAB — MAGNESIUM: Magnesium: 2.4 mg/dL (ref 1.7–2.4)

## 2024-07-01 LAB — GLUCOSE, CAPILLARY: Glucose-Capillary: 125 mg/dL — ABNORMAL HIGH (ref 70–99)
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
## 2024-07-01 NOTE — Progress Notes (Signed)
 PROGRESS NOTE    Raymond Cox  FMW:[id number] DOB: [birth year] DOA: 06/29/2024 PCP: [facility]   Brief Narrative:   63 y.o. male with medical history significant for essential hypertension, CKD 3B with baseline creatinine 1.4-1.8, seizures, who is admitted to [facility] on 06/29/2024 by way of transfer from [facility] with severe sepsis due to acute cystitis after presenting from home to the latter facility complaining of fever for 3-4 days associated with chills in the absence of whole body rigors or generalized myalgias, dysuria and urinary frequency, diagnosed with sepsis in the setting of urinary tract infection as well as E. coli bacteremia and is currently on intravenous antibiotics.  Clinically improving.  Assessment & Plan:  Principal Problem:   Severe sepsis (HCC) Active Problems:   Acute renal failure superimposed on stage 3b chronic kidney disease (HCC)   Acute cystitis   High anion gap metabolic acidosis   Generalized weakness   Transaminitis   History of essential hypertension   History of seizures    Severe sepsis secondary to UTI and E. coli bacteremia: improving Continue ceftriaxone  2 g IV daily Continue IV fluid for hydration Blood culture 1/4 growing E. coli, follow sensitivity report Follow urine culture-NGTD     AKI on CKD stage IIIb: Improving. Likely ATN in the setting of sepsis/UTI. sCr 2.13 on admission. Improved to 1.69 today. Received IVF Monitor renal function and urine output Avoid nephrotoxic medication, urinary dose medications   Metabolic acidosis due to AKI on CKD stage IIIb Acidosis resolved      Chronic transaminitis Trend LFTs daily    Hypertension Continue hydralazine  50 mg p.o. twice daily Monitor BP and titrate medication accordingly     H/o seizure disorder Continue Keppra  1000 mg p.o. twice daily, Depakote  1000 mg p.o. twice daily, Trileptal  600 mg p.o. twice daily, home dose     Generalized  weakness/Physical deconditioning secondary to bacteremia B12 and vitamin D  level within normal limits Continue PT and OT  DVT prophylaxis: enoxaparin  (LOVENOX ) injection 40 mg Start: 06/30/24 1800 SCDs Start: 06/29/24 2232  Disposition: Lives at home with his wife.      Code Status: Full Code Family Communication:  Wife at the bedside Status is: Inpatient Remains inpatient appropriate because: E.coli bacteremia    Subjective:  Patient feels better this morning.  Wife is present at bedside.  She said the patient has been sick for 4 days but did not seek medical attention.  As per patient, he was disoriented and drowsy yesterday but today he feels better.  We discussed about his diagnosis of UTI in the setting of E. coli bacteremia and further management.  Examination:  General exam: Appears calm and comfortable  Respiratory system: Clear to auscultation. Respiratory effort normal. Cardiovascular system: S1 & S2 heard, RRR. No JVD, murmurs, rubs, gallops or clicks. No pedal edema. Gastrointestinal system: Abdomen is nondistended, soft and nontender. No organomegaly or masses felt. Normal bowel sounds heard. Central nervous system: Alert and oriented. No focal neurological deficits. Extremities: Symmetric 5 x 5 power. Skin: No rashes, lesions or ulcers Psychiatry: Judgement and insight appear normal. Mood & affect appropriate.    Diet Orders (From admission, onward)     Start     Ordered   06/29/24 2232  Diet regular Room service appropriate? Yes; Fluid consistency: Thin  Diet effective now       Question Answer Comment  Room service appropriate? Yes   Fluid consistency: Thin      06/29/24  2231            Objective: Vitals:   07/01/24 0118 07/01/24 0446 07/01/24 0500 07/01/24 0749  BP: (!) 140/66 131/67  (!) 142/66  Pulse: 70 72  64  Resp: 18 16    Temp: (!) 97.5 F (36.4 C) 98.9 F (37.2 C)  98.8 F (37.1 C)  TempSrc:      SpO2: 94% 99%  100%  Weight:    88.1 kg   Height:        Intake/Output Summary (Last 24 hours) at 07/01/2024 1009 Last data filed at 07/01/2024 0600 Gross per 24 hour  Intake 1337 ml  Output 850 ml  Net 487 ml   Filed Weights   06/29/24 1734 06/29/24 2232 07/01/24 0500  Weight: 93 kg 87.4 kg 88.1 kg    Scheduled Meds:  divalproex   1,000 mg Oral Q12H   enoxaparin  (LOVENOX ) injection  40 mg Subcutaneous QPM   feeding supplement  237 mL Oral TID BM   hydrALAZINE   50 mg Oral BID   levETIRAcetam   1,000 mg Oral BID   oxcarbazepine   600 mg Oral BID   Continuous Infusions:  cefTRIAXone  (ROCEPHIN )  IV 200 mL/hr at 07/01/24 0329    Nutritional status     Body mass index is 24.94 kg/m.  Data Reviewed:   CBC: Recent Labs  Lab 06/29/24 1746 06/30/24 0402 07/01/24 0459  WBC 17.6* 15.3* 13.8*  NEUTROABS 12.8* 11.1*  --   HGB 15.6 12.8* 13.1  HCT 46.8 39.5 41.0  MCV 95.1 96.8 96.9  PLT 161 155 194   Basic Metabolic Panel: Recent Labs  Lab 06/29/24 1746 06/30/24 0402 07/01/24 0459  NA 139 142 141  K 4.2 4.3 4.1  CL 103 110 108  CO2 19* 23 23  GLUCOSE 123* 117* 107*  BUN 31* 27* 27*  CREATININE 2.13* 1.87* 1.69*  CALCIUM 9.3 8.1* 8.4*  MG  --  2.0 2.4  PHOS  --   --  4.1   GFR: Estimated Creatinine Clearance: 50.7 mL/min (A) (by C-G formula based on SCr of 1.69 mg/dL (H)). Liver Function Tests: Recent Labs  Lab 06/29/24 1746 06/30/24 0402 07/01/24 0459  AST 127* 72* 87*  ALT 175* 124* 140*  ALKPHOS 176* 104 115  BILITOT 0.7 0.8 0.2  PROT 7.8 6.0* 6.1*  ALBUMIN 3.8 2.5* 2.4*   No results for input(s): LIPASE, AMYLASE in the last 168 hours. No results for input(s): AMMONIA  in the last 168 hours. Coagulation Profile: Recent Labs  Lab 06/30/24 0558  INR 1.2   Cardiac Enzymes: Recent Labs  Lab 06/30/24 0558  CKTOTAL 212   BNP (last 3 results) No results for input(s): PROBNP in the last 8760 hours. HbA1C: No results for input(s): HGBA1C in the last 72  hours. CBG: No results for input(s): GLUCAP in the last 168 hours. Lipid Profile: No results for input(s): CHOL, HDL, LDLCALC, TRIG, CHOLHDL, LDLDIRECT in the last 72 hours. Thyroid Function Tests: Recent Labs    06/30/24 0558  TSH 1.626   Anemia Panel: Recent Labs    06/30/24 0558  VITAMINB12 572  FOLATE 13.3   Sepsis Labs: Recent Labs  Lab 06/29/24 1746 06/29/24 1932 06/30/24 0402 06/30/24 0558  LATICACIDVEN 1.3 1.4 1.1 0.9    Recent Results (from the past 240 hours)  Resp panel by RT-PCR (RSV, Flu A&B, Covid) Anterior Nasal Swab     Status: None   Collection Time: 06/29/24  5:46 PM   Specimen: Anterior  Nasal Swab  Result Value Ref Range Status   SARS Coronavirus 2 by RT PCR NEGATIVE NEGATIVE Final    Comment: (NOTE) SARS-CoV-2 target nucleic acids are NOT DETECTED.  The SARS-CoV-2 RNA is generally detectable in upper respiratory specimens during the acute phase of infection. The lowest concentration of SARS-CoV-2 viral copies this assay can detect is 138 copies/mL. A negative result does not preclude SARS-Cov-2 infection and should not be used as the sole basis for treatment or other patient management decisions. A negative result may occur with  improper specimen collection/handling, submission of specimen other than nasopharyngeal swab, presence of viral mutation(s) within the areas targeted by this assay, and inadequate number of viral copies(<138 copies/mL). A negative result must be combined with clinical observations, patient history, and epidemiological information. The expected result is Negative.  Fact Sheet for Patients:  BloggerCourse.com  Fact Sheet for Healthcare Providers:  SeriousBroker.it  This test is no t yet approved or cleared by the [place]  FDA and  has been authorized for detection and/or diagnosis of SARS-CoV-2 by FDA under an Emergency Use Authorization (EUA). This  EUA will remain  in effect (meaning this test can be used) for the duration of the COVID-19 declaration under Section 564(b)(1) of the Act, 21 U.S.C.section 360bbb-3(b)(1), unless the authorization is terminated  or revoked sooner.       Influenza A by PCR NEGATIVE NEGATIVE Final   Influenza B by PCR NEGATIVE NEGATIVE Final    Comment: (NOTE) The Xpert Xpress SARS-CoV-2/FLU/RSV plus assay is intended as an aid in the diagnosis of influenza from Nasopharyngeal swab specimens and should not be used as a sole basis for treatment. Nasal washings and aspirates are unacceptable for Xpert Xpress SARS-CoV-2/FLU/RSV testing.  Fact Sheet for Patients: BloggerCourse.com  Fact Sheet for Healthcare Providers: SeriousBroker.it  This test is not yet approved or cleared by the [place]  FDA and has been authorized for detection and/or diagnosis of SARS-CoV-2 by FDA under an Emergency Use Authorization (EUA). This EUA will remain in effect (meaning this test can be used) for the duration of the COVID-19 declaration under Section 564(b)(1) of the Act, 21 U.S.C. section 360bbb-3(b)(1), unless the authorization is terminated or revoked.     Resp Syncytial Virus by PCR NEGATIVE NEGATIVE Final    Comment: (NOTE) Fact Sheet for Patients: BloggerCourse.com  Fact Sheet for Healthcare Providers: SeriousBroker.it  This test is not yet approved or cleared by the [place]  FDA and has been authorized for detection and/or diagnosis of SARS-CoV-2 by FDA under an Emergency Use Authorization (EUA). This EUA will remain in effect (meaning this test can be used) for the duration of the COVID-19 declaration under Section 564(b)(1) of the Act, 21 U.S.C. section 360bbb-3(b)(1), unless the authorization is terminated or revoked.  Performed at [facility], [address] [place], [place] [address]   Urine Culture     Status: None   Collection Time: 06/29/24  5:46 PM   Specimen: Urine, Random  Result Value Ref Range Status   Specimen Description   Final    URINE, RANDOM Performed at [facility], [address] [place], [place] [address]    Special Requests   Final    NONE Reflexed from [id number] Performed at [facility], [address] [place], [place] [address]    Culture   Final    NO GROWTH Performed at [facility], [address], [place], [place] [address]    Report Status 07/01/2024  FINAL  Final  Blood culture (routine x 2)     Status: None (Preliminary result)   Collection Time: 06/29/24  5:51 PM   Specimen: BLOOD  Result Value Ref Range Status   Specimen Description   Final    BLOOD LEFT ANTECUBITAL Performed at [facility], [address] [place], [place] [address]    Special Requests   Final    BOTTLES DRAWN AEROBIC AND ANAEROBIC Blood Culture adequate volume Performed at [facility], [address] [place], [place] [address]    Culture   Final    NO GROWTH 2 DAYS Performed at [facility], [address], [place], [place] [address]    Report Status PENDING  Incomplete  Blood culture (routine x 2)     Status: Abnormal (Preliminary result)   Collection Time: 06/29/24  5:52 PM   Specimen: BLOOD  Result Value Ref Range Status   Specimen Description   Final    BLOOD RIGHT ANTECUBITAL Performed at [facility], [address], [place], [place] [address]    Special Requests   Final    BOTTLES DRAWN AEROBIC AND ANAEROBIC Blood Culture results may not be optimal due to an inadequate volume of blood received in culture bottles Performed at [facility], [address] [place], [place] [address]    Culture  Setup Time   Final    GRAM NEGATIVE RODS ANAEROBIC BOTTLE ONLY CRITICAL RESULT CALLED  TO, READ BACK BY AND VERIFIED WITH: PHARMD JESSICA MILLEN [id number] AT 0815 BY EC    Culture (A)  Final    ESCHERICHIA COLI SUSCEPTIBILITIES TO FOLLOW Performed at [facility], [address], [place], [place] [address]    Report Status PENDING  Incomplete  Blood Culture ID Panel (Reflexed)     Status: Abnormal   Collection Time: 06/29/24  5:52 PM  Result Value Ref Range Status   Enterococcus faecalis NOT DETECTED NOT DETECTED Final   Enterococcus Faecium NOT DETECTED NOT DETECTED Final   Listeria monocytogenes NOT DETECTED NOT DETECTED Final   Staphylococcus species NOT DETECTED NOT DETECTED Final   Staphylococcus aureus (BCID) NOT DETECTED NOT DETECTED Final   Staphylococcus epidermidis NOT DETECTED NOT DETECTED Final   Staphylococcus lugdunensis NOT DETECTED NOT DETECTED Final   Streptococcus species NOT DETECTED NOT DETECTED Final   Streptococcus agalactiae NOT DETECTED NOT DETECTED Final   Streptococcus pneumoniae NOT DETECTED NOT DETECTED Final   Streptococcus pyogenes NOT DETECTED NOT DETECTED Final   A.calcoaceticus-baumannii NOT DETECTED NOT DETECTED Final   Bacteroides fragilis NOT DETECTED NOT DETECTED Final   Enterobacterales DETECTED (A) NOT DETECTED Final    Comment: Enterobacterales represent a large order of gram negative bacteria, not a single organism. CRITICAL RESULT CALLED TO, READ BACK BY AND VERIFIED WITH: PHARMD JESSICA MILLEN [id number] AT 0815 BY EC    Enterobacter cloacae complex NOT DETECTED NOT DETECTED Final   Escherichia coli DETECTED (A) NOT DETECTED Final    Comment: CRITICAL RESULT CALLED TO, READ BACK BY AND VERIFIED WITH: PHARMD JESSICA MILLEN [id number] AT 0815 BY EC    Klebsiella aerogenes NOT DETECTED NOT DETECTED Final   Klebsiella oxytoca NOT DETECTED NOT DETECTED Final   Klebsiella pneumoniae NOT DETECTED NOT DETECTED Final   Proteus species NOT DETECTED NOT DETECTED Final   Salmonella species NOT DETECTED NOT DETECTED Final   Serratia  marcescens NOT DETECTED NOT DETECTED Final   Haemophilus influenzae NOT DETECTED NOT DETECTED Final   Neisseria  meningitidis NOT DETECTED NOT DETECTED Final   Pseudomonas aeruginosa NOT DETECTED NOT DETECTED Final   Stenotrophomonas maltophilia NOT DETECTED NOT DETECTED Final   Candida albicans NOT DETECTED NOT DETECTED Final   Candida auris NOT DETECTED NOT DETECTED Final   Candida glabrata NOT DETECTED NOT DETECTED Final   Candida krusei NOT DETECTED NOT DETECTED Final   Candida parapsilosis NOT DETECTED NOT DETECTED Final   Candida tropicalis NOT DETECTED NOT DETECTED Final   Cryptococcus neoformans/gattii NOT DETECTED NOT DETECTED Final   CTX-M ESBL NOT DETECTED NOT DETECTED Final   Carbapenem resistance IMP NOT DETECTED NOT DETECTED Final   Carbapenem resistance KPC NOT DETECTED NOT DETECTED Final   Carbapenem resistance NDM NOT DETECTED NOT DETECTED Final   Carbapenem resist OXA 48 LIKE NOT DETECTED NOT DETECTED Final   Carbapenem resistance VIM NOT DETECTED NOT DETECTED Final    Comment: Performed at [facility], [address] [place], [place] [address]         Radiology Studies: CT ABDOMEN PELVIS WO CONTRAST Result Date: 06/29/2024 CLINICAL DATA:  Abdominal pain, acute, nonlocalized abd pain, sepsis. Fever. Fatigue, vomiting abdominal pain EXAM: CT ABDOMEN AND PELVIS WITHOUT CONTRAST TECHNIQUE: Multidetector CT imaging of the abdomen and pelvis was performed following the standard protocol without IV contrast. RADIATION DOSE REDUCTION: This exam was performed according to the departmental dose-optimization program which includes automated exposure control, adjustment of the mA and/or kV according to patient size and/or use of iterative reconstruction technique. COMPARISON:  None Available. FINDINGS: Lower chest: No acute abnormality. Hepatobiliary: No focal liver abnormality. No gallstones, gallbladder wall thickening, or pericholecystic fluid. No biliary dilatation.  Pancreas: No focal lesion. Normal pancreatic contour. No surrounding inflammatory changes. No main pancreatic ductal dilatation. Spleen: Normal in size without focal abnormality. Adrenals/Urinary Tract: No adrenal nodule bilaterally. Nonspecific bilateral mild perinephric fat stranding. No nephrolithiasis and no hydronephrosis. No definite contour-deforming renal mass. No ureterolithiasis or hydroureter. The urinary bladder is unremarkable. Stomach/Bowel: Stomach is within normal limits. No evidence of bowel wall thickening or dilatation. The appendix is not definitely identified with no inflammatory changes in the right lower quadrant to suggest acute appendicitis. Vascular/Lymphatic: No abdominal aorta or iliac aneurysm. Moderate atherosclerotic plaque of the aorta and its branches. No abdominal, pelvic, or inguinal lymphadenopathy. Reproductive: Prostate is unremarkable. Other: No intraperitoneal free fluid. No intraperitoneal free gas. No organized fluid collection. Musculoskeletal: No abdominal wall hernia or abnormality. No suspicious lytic or blastic osseous lesions. No acute displaced fracture. Multilevel degenerative changes of the spine. IMPRESSION: 1. No acute intra-abdominal or intrapelvic abnormality with limited evaluation on this noncontrast study. 2.  Aortic Atherosclerosis (ICD10-I70.0). Electronically Signed   By: Morgane  Naveau M.D.   On: 06/29/2024 19:51   DG Chest Port 1 View Result Date: 06/29/2024 CLINICAL DATA:  Fatigue, fever, abdominal pain, and vomiting. EXAM: PORTABLE CHEST 1 VIEW COMPARISON:  02/13/2022. FINDINGS: The heart size and mediastinal contours are within normal limits. There is atherosclerotic calcification of the aorta. No consolidation, effusion, or pneumothorax is seen. No acute osseous abnormality. IMPRESSION: No active disease. Electronically Signed   By: Leita Birmingham M.D.   On: 06/29/2024 18:36        LOS: 2 days   Time spent= 41 mins    Deliliah Room,  MD [facility]  If 7PM-7AM, please contact night-coverage  07/01/2024, 10:09 AM

## 2024-07-01 NOTE — Progress Notes (Signed)
 Mobility Specialist: Progress Note   07/01/24 1200  Mobility  Activity Ambulated with assistance  Level of Assistance Standby assist, set-up cues, supervision of patient - no hands on  Assistive Device None  Distance Ambulated (ft) 650 ft  Activity Response Tolerated well  Mobility Referral Yes  Mobility visit 1 Mobility  Mobility Specialist Start Time (ACUTE ONLY) 0850  Mobility Specialist Stop Time (ACUTE ONLY) 0856  Mobility Specialist Time Calculation (min) (ACUTE ONLY) 6 min    Pt received standing at sink, agreeable to mobility session. Daughter present. SV throughout. C/o RLE pain rated 10/10 but stated that is his baseline and he normally ambulates with a cane because of it. Ambulated 650' and returned to room without fault. Left standing at bedside with all needs met, call bell in reach.   Ileana Lute Mobility Specialist Please contact via SecureChat or Rehab office at [phone]

## 2024-07-01 NOTE — Plan of Care (Signed)

## 2024-07-02 DIAGNOSIS — A419 Sepsis, unspecified organism: Secondary | ICD-10-CM | POA: Diagnosis not present

## 2024-07-02 DIAGNOSIS — R6520 Severe sepsis without septic shock: Secondary | ICD-10-CM | POA: Diagnosis not present

## 2024-07-02 LAB — CULTURE, BLOOD (ROUTINE X 2)

## 2024-07-02 LAB — CBC WITH DIFFERENTIAL/PLATELET
Abs Immature Granulocytes: 0.21 K/uL — ABNORMAL HIGH (ref 0.00–0.07)
Basophils Absolute: 0 K/uL (ref 0.0–0.1)
Basophils Relative: 0 %
Eosinophils Absolute: 0 K/uL (ref 0.0–0.5)
Eosinophils Relative: 0 %
HCT: 39.2 % (ref 39.0–52.0)
Hemoglobin: 12.6 g/dL — ABNORMAL LOW (ref 13.0–17.0)
Immature Granulocytes: 2 %
Lymphocytes Relative: 12 %
Lymphs Abs: 1.3 K/uL (ref 0.7–4.0)
MCH: 31.5 pg (ref 26.0–34.0)
MCHC: 32.1 g/dL (ref 30.0–36.0)
MCV: 98 fL (ref 80.0–100.0)
Monocytes Absolute: 2.1 K/uL — ABNORMAL HIGH (ref 0.1–1.0)
Monocytes Relative: 20 %
Neutro Abs: 6.9 K/uL (ref 1.7–7.7)
Neutrophils Relative %: 66 %
Platelets: 221 K/uL (ref 150–400)
RBC: 4 MIL/uL — ABNORMAL LOW (ref 4.22–5.81)
RDW: 13.7 % (ref 11.5–15.5)
WBC: 10.6 K/uL — ABNORMAL HIGH (ref 4.0–10.5)
nRBC: 0 % (ref 0.0–0.2)

## 2024-07-02 LAB — BASIC METABOLIC PANEL WITH GFR
Anion gap: 11 (ref 5–15)
BUN: 25 mg/dL — ABNORMAL HIGH (ref 8–23)
CO2: 21 mmol/L — ABNORMAL LOW (ref 22–32)
Calcium: 8.4 mg/dL — ABNORMAL LOW (ref 8.9–10.3)
Chloride: 107 mmol/L (ref 98–111)
Creatinine, Ser: 1.52 mg/dL — ABNORMAL HIGH (ref 0.61–1.24)
GFR, Estimated: 51 mL/min — ABNORMAL LOW (ref >60)
Glucose, Bld: 109 mg/dL — ABNORMAL HIGH (ref 70–99)
Potassium: 4.1 mmol/L (ref 3.5–5.1)
Sodium: 139 mmol/L (ref 135–145)

## 2024-07-02 LAB — HEPATIC FUNCTION PANEL
ALT: 139 U/L — ABNORMAL HIGH (ref 0–44)
AST: 77 U/L — ABNORMAL HIGH (ref 15–41)
Albumin: 2.4 g/dL — ABNORMAL LOW (ref 3.5–5.0)
Alkaline Phosphatase: 113 U/L (ref 38–126)
Bilirubin, Direct: <0.1 mg/dL (ref 0.0–0.2)
Total Bilirubin: 0.6 mg/dL (ref 0.0–1.2)
Total Protein: 6 g/dL — ABNORMAL LOW (ref 6.5–8.1)

## 2024-07-02 MED ORDER — CEFAZOLIN SODIUM-DEXTROSE 2-4 GM/100ML-% IV SOLN
2.0000 g | Freq: Three times a day (TID) | INTRAVENOUS | Status: DC
  Administered 2024-07-02 – 2024-07-03 (×6): 2 g via INTRAVENOUS
  Filled 2024-07-02 (×3): qty 100

## 2024-07-02 NOTE — TOC Progression Note (Signed)
 Transition of Care [facility]) - Progression Note    Patient Details  Name: Raymond Cox MRN: [id number] Date of Birth: 04-10-1961  [facility] of [facility]) CM/SW Contact  Rosaline JONELLE Joe, RN Phone Number: 07/02/2024, 2:06 PM  Clinical Narrative:    CM spoke with the patient's spouse at the bedside and wife was requesting aide at the home.  Wife states that patient already receives [facility] through [place].  I asked that wife contact [facility] Social worker to assist with increase with aide services hours.  Wife was unsure about patient's medicare plan and did not have a copy of the card.   Expected Discharge Plan: Home w Home Health Services Barriers to Discharge: Continued Medical Work up               Expected Discharge Plan and Services   Discharge Planning Services: CM Consult Post Acute Care Choice: Home Health Living arrangements for the past 2 months: Single Family Home                 DME Arranged:  (Patient declined 3:1)         HH Arranged: OT, PT HH Agency:  ([facility]) Date HH Agency Contacted: 07/01/24 Time HH Agency Contacted: 1105 Representative spoke with at [facility] Agency: Camelia, CM with [employer] [phone]   Social Drivers of Health (SDOH) Interventions SDOH Screenings   Food Insecurity: No Food Insecurity (06/29/2024)  Housing: Low Risk  (06/29/2024)  Transportation Needs: No Transportation Needs (06/29/2024)  Utilities: Not At Risk (06/29/2024)  Social Connections: Socially Integrated (06/29/2024)  Tobacco Use: Low Risk  (06/29/2024)    Readmission Risk Interventions    07/01/2024   11:07 AM  Readmission Risk Prevention Plan  Transportation Screening Complete  PCP or Specialist Appt within 5-7 Days Complete  Home Care Screening Complete  Medication Review (RN CM) Complete

## 2024-07-02 NOTE — Progress Notes (Signed)
 Mobility Specialist: Progress Note   07/02/24 1600  Mobility  Activity Ambulated with assistance  Level of Assistance Contact guard assist, steadying assist  Assistive Device Cane  Distance Ambulated (ft) 400 ft  Activity Response Tolerated well  Mobility Referral Yes  Mobility visit 1 Mobility  Mobility Specialist Start Time (ACUTE ONLY) 1420  Mobility Specialist Stop Time (ACUTE ONLY) 1439  Mobility Specialist Time Calculation (min) (ACUTE ONLY) 19 min    Pt received in bed, agreeable to mobility session. Not feeling well today, c/o headache and feeling constipated. CGA throughout d/t unsteadiness. A little more SOB this session but still motivated to ambulate further than morning PT session. Returned to room without fault. Left in BR with all needs met, call bell in reach. Visitor in room.   Ileana Lute Mobility Specialist Please contact via SecureChat or Rehab office at [phone]

## 2024-07-02 NOTE — Progress Notes (Signed)
 Occupational Therapy Treatment Patient Details Name: Raymond Cox MRN: [id number] DOB: [birth year] Today's Date: 07/02/2024   History of present illness 63 y.o. male presents to [facility] on 8/9 from [facility] with severe sepsis 2/2 acute cystitis. Also with AKI superimposed on CKD 3B. PMH includes: CAD, HTN, renal disorder, seizures.   OT comments  Pt supine in bed and agreeable to OT with encouragement.   Pt requires min assist to stand from EOB using cane support, simulated shower transfers with contact guard assist but improved stability after educated on technique with BUE support vs 1 UE support.  Using cane, unsteady and needing contact guard assist- may benefit from RW to reduce risk of falls and plan to try next session. Pt clearly fatigued during session, reviewed energy conservation techniques and provided handout.  Will follow acutely, he has good support of spouse at home.  Continue to recommend HHOT services at dc.       If plan is discharge home, recommend the following:  A little help with walking and/or transfers;A little help with bathing/dressing/bathroom;Assistance with cooking/housework;Assist for transportation;Help with stairs or ramp for entrance   Equipment Recommendations  Tub/shower seat    Recommendations for Other Services      Precautions / Restrictions Precautions Precautions: Fall Recall of Precautions/Restrictions: Intact Restrictions Weight Bearing Restrictions Per Provider Order: No       Mobility Bed Mobility Overal bed mobility: Needs Assistance Bed Mobility: Supine to Sit, Sit to Supine     Supine to sit: Supervision Sit to supine: Supervision   General bed mobility comments: for safety    Transfers Overall transfer level: Needs assistance Equipment used: Straight cane Transfers: Sit to/from Stand Sit to Stand: Min assist           General transfer comment: pt initailly uses momentum with cane between legs and both hands on cane to  power up with contact guard assist for safety; educated on keeping 1 hand on bed to push up and needing min assist     Balance Overall balance assessment: Needs assistance Sitting-balance support: No upper extremity supported, Feet supported, Single extremity supported Sitting balance-Leahy Scale: Good     Standing balance support: Single extremity supported, During functional activity, Bilateral upper extremity supported Standing balance-Leahy Scale: Fair Standing balance comment: relies on cane, contact guard for safety due to unsteadiness                           ADL either performed or assessed with clinical judgement   ADL Overall ADL's : Needs assistance/impaired                     Lower Body Dressing: Minimal assistance;Sit to/from stand   Toilet Transfer: Minimal assistance;Ambulation Toilet Transfer Details (indicate cue type and reason): cane     Tub/ Shower Transfer: Contact guard assist;Walk-in shower;Ambulation Tub/Shower Transfer Details (indicate cue type and reason): improved safety and stability using BUE support on wall to step over threshold Functional mobility during ADLs: Contact guard assist;Cane General ADL Comments: provided energy conservation handout, educated on compensatory strategies, safety and fall prevention.    Extremity/Trunk Assessment              Vision       Restaurant manager, fast food Communication: No apparent difficulties   Cognition Arousal: Alert Behavior During Therapy: Flat affect Cognition: Cognition impaired  Awareness: Online awareness impaired   Attention impairment (select first level of impairment): Selective attention Executive functioning impairment (select all impairments): Problem solving OT - Cognition Comments: pt requires increased time to initate, poor safety awareness and demonstrating decreased problem solving.  pt fatigued during session. per PT hx of  brain injury? per spouse.  Will benefit from further cognitive assessment.                 Following commands: Intact        Cueing   Cueing Techniques: Verbal cues  Exercises      Shoulder Instructions       General Comments spouse present and supportive    Pertinent Vitals/ Pain       Pain Assessment Pain Assessment: No/denies pain Pain Intervention(s): Monitored during session  Home Living                                          Prior Functioning/Environment              Frequency  Min 2X/week        Progress Toward Goals  OT Goals(current goals can now be found in the care plan section)  Progress towards OT goals: Progressing toward goals (slowly)  Acute Rehab OT Goals Patient Stated Goal: get better OT Goal Formulation: With patient Time For Goal Achievement: 07/14/24 Potential to Achieve Goals: Good  Plan      Co-evaluation                 AM-PAC OT 6 Clicks Daily Activity     Outcome Measure   Help from another person eating meals?: A Little Help from another person taking care of personal grooming?: A Little Help from another person toileting, which includes using toliet, bedpan, or urinal?: A Little Help from another person bathing (including washing, rinsing, drying)?: A Little Help from another person to put on and taking off regular upper body clothing?: A Little Help from another person to put on and taking off regular lower body clothing?: A Little 6 Click Score: 18    End of Session Equipment Utilized During Treatment: Other (comment) (cane)  OT Visit Diagnosis: Other abnormalities of gait and mobility (R26.89);Muscle weakness (generalized) (M62.81)   Activity Tolerance Patient limited by fatigue   Patient Left in bed;with call bell/phone within reach;with family/visitor present   Nurse Communication Mobility status        Time: 8778-8753 OT Time Calculation (min): 25 min  Charges: OT  General Charges $OT Visit: 1 Visit OT Treatments $Self Care/Home Management : 23-37 mins  Etta NOVAK, OT Acute Rehabilitation Services Office [phone] Secure Chat Preferred    Etta GORMAN Hope 07/02/2024, 1:31 PM

## 2024-07-02 NOTE — Progress Notes (Signed)
 Occupational Therapy Treatment Patient Details Name: Raymond Cox MRN: [id number] DOB: May 06, 1961 Today's Date: 07/02/2024   History of present illness 63 y.o. male presents to [facility] on 8/9 from [facility] with severe sepsis 2/2 acute cystitis. Also with AKI superimposed on CKD 3B. PMH includes: CAD, HTN, renal disorder, seizures.   OT comments  Pt supine in bed and agreeable to OT with encouragement.   Pt requires min assist to stand from EOB using cane support, simulated shower transfers with contact guard assist but improved stability after educated on technique with BUE support vs 1 UE support.  Using cane, unsteady and needing contact guard assist- may benefit from RW to reduce risk of falls and plan to try next session. Pt clearly fatigued during session, reviewed energy conservation techniques and provided handout.  Will follow acutely, he has good support of spouse at home.  Continue to recommend HHOT services at dc.       If plan is discharge home, recommend the following:  A little help with walking and/or transfers;A little help with bathing/dressing/bathroom;Assistance with cooking/housework;Assist for transportation;Help with stairs or ramp for entrance   Equipment Recommendations  Tub/shower seat    Recommendations for Other Services      Precautions / Restrictions Precautions Precautions: Fall Recall of Precautions/Restrictions: Intact Restrictions Weight Bearing Restrictions Per Provider Order: No       Mobility Bed Mobility Overal bed mobility: Needs Assistance Bed Mobility: Supine to Sit, Sit to Supine     Supine to sit: Supervision Sit to supine: Supervision   General bed mobility comments: for safety    Transfers Overall transfer level: Needs assistance Equipment used: Straight cane Transfers: Sit to/from Stand Sit to Stand: Min assist           General transfer comment: pt initailly uses momentum with cane between legs and both hands on cane to  power up with contact guard assist for safety; educated on keeping 1 hand on bed to push up and needing min assist     Balance Overall balance assessment: Needs assistance Sitting-balance support: No upper extremity supported, Feet supported, Single extremity supported Sitting balance-Leahy Scale: Good     Standing balance support: Single extremity supported, During functional activity, Bilateral upper extremity supported Standing balance-Leahy Scale: Fair Standing balance comment: relies on cane, contact guard for safety due to unsteadiness                           ADL either performed or assessed with clinical judgement   ADL Overall ADL's : Needs assistance/impaired                     Lower Body Dressing: Minimal assistance;Sit to/from stand   Toilet Transfer: Minimal assistance;Ambulation Toilet Transfer Details (indicate cue type and reason): cane     Tub/ Shower Transfer: Contact guard assist;Walk-in shower;Ambulation Tub/Shower Transfer Details (indicate cue type and reason): improved safety and stability using BUE support on wall to step over threshold Functional mobility during ADLs: Contact guard assist;Cane General ADL Comments: provided energy conservation handout, educated on compensatory strategies, safety and fall prevention.    Extremity/Trunk Assessment              Vision       Restaurant manager, fast food Communication: No apparent difficulties   Cognition Arousal: Alert Behavior During Therapy: Flat affect Cognition: Cognition impaired  Awareness: Online awareness impaired   Attention impairment (select first level of impairment): Selective attention Executive functioning impairment (select all impairments): Problem solving OT - Cognition Comments: pt requires increased time to initate, poor safety awareness and demonstrating decreased problem solving.  pt fatigued during session. per PT hx of  brain injury? per spouse.  Will benefit from further cognitive assessment.                 Following commands: Intact        Cueing   Cueing Techniques: Verbal cues  Exercises      Shoulder Instructions       General Comments spouse present and supportive    Pertinent Vitals/ Pain       Pain Assessment Pain Assessment: No/denies pain Pain Intervention(s): Monitored during session  Home Living                                          Prior Functioning/Environment              Frequency  Min 2X/week        Progress Toward Goals  OT Goals(current goals can now be found in the care plan section)  Progress towards OT goals: Progressing toward goals (slowly)  Acute Rehab OT Goals Patient Stated Goal: get better OT Goal Formulation: With patient Time For Goal Achievement: 07/14/24 Potential to Achieve Goals: Good  Plan      Co-evaluation                 AM-PAC OT 6 Clicks Daily Activity     Outcome Measure   Help from another person eating meals?: A Little Help from another person taking care of personal grooming?: A Little Help from another person toileting, which includes using toliet, bedpan, or urinal?: A Little Help from another person bathing (including washing, rinsing, drying)?: A Little Help from another person to put on and taking off regular upper body clothing?: A Little Help from another person to put on and taking off regular lower body clothing?: A Little 6 Click Score: 18    End of Session Equipment Utilized During Treatment: Other (comment) (cane)  OT Visit Diagnosis: Other abnormalities of gait and mobility (R26.89);Muscle weakness (generalized) (M62.81)   Activity Tolerance Patient limited by fatigue   Patient Left in bed;with call bell/phone within reach;with family/visitor present   Nurse Communication Mobility status        Time: 8778-8753 OT Time Calculation (min): 25 min  Charges: OT  General Charges $OT Visit: 1 Visit OT Treatments $Self Care/Home Management : 23-37 mins  Raymond Cox, OT Acute Rehabilitation Services Office [phone] Secure Chat Preferred    Raymond Cox 07/02/2024, 1:31 PM

## 2024-07-02 NOTE — Plan of Care (Signed)

## 2024-07-02 NOTE — Progress Notes (Signed)
 Physical Therapy Treatment Patient Details Name: Raymond Cox MRN: [id number] DOB: [birth year] Today's Date: 07/02/2024   History of Present Illness 63 y.o. male presents to [facility] on 8/9 from [facility] with severe sepsis 2/2 acute cystitis. Also with AKI superimposed on CKD 3B. PMH includes: CAD, HTN, renal disorder, seizures.    PT Comments  Pt with decline in endurance today, reporting increased fatigue from headache yesterday. Wife reports brain injury during [employer]. Pt is supervision for bed mobility. Pt requiring contact guard assist for ambulation without AD in room and with SPC in hallway, experiencing 1x LoB requiring min A for steadying. Pt with increased WoB but vitals stable after ambulation SpO2 on RA 100% HR 89 bpm. D/c plans remain appropriate at this time. PT will continue to follow acutely.     If plan is discharge home, recommend the following: A little help with walking and/or transfers;A little help with bathing/dressing/bathroom;Assist for transportation;Help with stairs or ramp for entrance   Can travel by private vehicle      yes  Equipment Recommendations  None recommended by PT       Precautions / Restrictions Precautions Precautions: Fall Recall of Precautions/Restrictions: Intact Restrictions Weight Bearing Restrictions Per Provider Order: No     Mobility  Bed Mobility Overal bed mobility: Needs Assistance Bed Mobility: Supine to Sit, Sit to Supine     Supine to sit: Supervision Sit to supine: Supervision   General bed mobility comments: for safety    Transfers Overall transfer level: Needs assistance Equipment used: None, Straight cane Transfers: Sit to/from Stand Sit to Stand: Contact guard assist           General transfer comment: good power up requiring min A for steadyin in standing due to increased posterior lean upon standing, on second attempt from bed with SPC CGA for safety    Ambulation/Gait Ambulation/Gait  assistance: Contact guard assist, Supervision, Min assist Gait Distance (Feet): 10 Feet (+100) Assistive device: None Gait Pattern/deviations: Step-through pattern, Decreased stride length, Trunk flexed Gait velocity: decr Gait velocity interpretation: <1.31 ft/sec, indicative of household ambulator   General Gait Details: pt ambulates to bathroom without AD but reaches out for sink and door frame to steady, with coming out of bathroom pt has LoB posteriorly requiring minA for steadying. PT had pt sit back on bed prior to ambulation VSS. After short seated rest break pt able to ambulate with SPC and contact guard assist, pt report R hip tightness and is noted to have increased posterior lean to progress R foot in swing phase, pt with incrased work of breathing after 100 feet ambulation and request to return to bed         Balance Overall balance assessment: Mild deficits observed, not formally tested (requires CGA for safety with dynamic balance.)                                          Communication Communication Communication: No apparent difficulties  Cognition Arousal: Alert Behavior During Therapy: Flat affect   PT - Cognitive impairments: No apparent impairments                         Following commands: Intact      Cueing Cueing Techniques: Verbal cues     General Comments General comments (skin integrity, edema, etc.): spouse present throughout session  and is worrined about pt headaches      Pertinent Vitals/Pain Pain Assessment Pain Assessment: Faces Faces Pain Scale: Hurts a little bit Pain Location: headache worse yesterday, R hip tightness with ambulation Pain Descriptors / Indicators: Headache, Tightness Pain Intervention(s): Limited activity within patient's tolerance, Monitored during session, Repositioned     PT Goals (current goals can now be found in the care plan section) Acute Rehab PT Goals Patient Stated Goal: to feel  better PT Goal Formulation: With patient/family Time For Goal Achievement: 07/14/24 Potential to Achieve Goals: Good Progress towards PT goals: Not progressing toward goals - comment    Frequency    Min 2X/week       AM-PAC PT 6 Clicks Mobility   Outcome Measure  Help needed turning from your back to your side while in a flat bed without using bedrails?: A Little Help needed moving from lying on your back to sitting on the side of a flat bed without using bedrails?: A Little Help needed moving to and from a bed to a chair (including a wheelchair)?: A Little Help needed standing up from a chair using your arms (e.g., wheelchair or bedside chair)?: A Little Help needed to walk in hospital room?: A Little Help needed climbing 3-5 steps with a railing? : A Little 6 Click Score: 18    End of Session Equipment Utilized During Treatment: Gait belt Activity Tolerance: Other (comment) (limited by feeling lightheaded) Patient left: in bed;with call bell/phone within reach;with family/visitor present;with bed alarm set Nurse Communication: Mobility status;Patient requests pain meds PT Visit Diagnosis: Other abnormalities of gait and mobility (R26.89);Muscle weakness (generalized) (M62.81)     Time: 9046-8983 PT Time Calculation (min) (ACUTE ONLY): 23 min  Charges:    $Gait Training: 8-22 mins $Therapeutic Activity: 8-22 mins PT General Charges $$ ACUTE PT VISIT: 1 Visit                     Raymond Cox PT, DPT Acute Rehabilitation Services Please use secure chat or  Call Office [phone]    Raymond Cox 07/02/2024, 1:05 PM

## 2024-07-02 NOTE — Progress Notes (Signed)
  Progress Note   Patient: Raymond Cox FMW:[id number] DOB: [birth year] DOA: 06/29/2024     3 DOS: the patient was seen and examined on 07/02/2024   Brief hospital course: 63 y.o. male with medical history significant for essential hypertension, CKD 3B with baseline creatinine 1.4-1.8, seizures, who is admitted to [facility] on 06/29/2024 by way of transfer from [facility] with severe sepsis due to acute cystitis after presenting from home to the latter facility complaining of fever for 3-4 days associated with chills in the absence of whole body rigors or generalized myalgias, dysuria and urinary frequency, diagnosed with sepsis in the setting of urinary tract infection as well as E. coli bacteremia and is currently on intravenous antibiotics.  Clinically improving.   Assessment and Plan: Severe sepsis due to UTI/e.coli bacteremia  - IV ceftriaxone  2 g daily  - Repeat blood cx's ordered 07/02/2024  AKI on CKD 3b - Kidney function slowly improving   Chronic transaminitis  - LFT slowly downtrending   HTN  - Hydralazine  50 mg PO bid   Hx of seizure disorder - Keppra  1000 mg PO bid  - Trileptal  600 mg PO bid  - Depakote  DR 1000 mg PO q12   Physical deconditioning  - PT/OT have been consulted - Ensure plus high protein tid   Subjective: Pt seen and examined at the bedside. WBC downtrending 13 -->10. Continues on IV ceftriaxone  2 g daily. Repeat blood cx's ordered today. Repeat labs in the AM.  Physical Exam: Vitals:   07/01/24 2047 07/01/24 2219 07/02/24 0056 07/02/24 0517  BP: (!) 142/82 (!) 145/75 (!) 152/77 (!) 140/75  Pulse: 79 63 67 62  Resp:      Temp: 99.8 F (37.7 C) 99 F (37.2 C) 97.7 F (36.5 C) 97.6 F (36.4 C)  TempSrc: Oral Oral    SpO2: 97% 100% 95% 100%  Weight:      Height:       Physical Exam HENT:     Head: Normocephalic.  Cardiovascular:     Rate and Rhythm: Normal rate.  Pulmonary:     Effort: Pulmonary effort is normal.  Abdominal:      General: Abdomen is flat.     Palpations: Abdomen is soft.  Skin:    General: Skin is warm.  Neurological:     Mental Status: He is alert.  Psychiatric:        Mood and Affect: Mood normal.      Disposition: Status is: Inpatient Remains inpatient appropriate because: IV antibx and serial labs   Planned Discharge Destination: Barriers to discharge: IV antibx as above     Time spent: 35 minutes  Author: Autry Prust , MD 07/02/2024 12:28 PM  For on call review [email or web address].

## 2024-07-03 DIAGNOSIS — A419 Sepsis, unspecified organism: Secondary | ICD-10-CM | POA: Diagnosis not present

## 2024-07-03 DIAGNOSIS — R6520 Severe sepsis without septic shock: Secondary | ICD-10-CM | POA: Diagnosis not present

## 2024-07-03 LAB — CBC
HCT: 38.5 % — ABNORMAL LOW (ref 39.0–52.0)
Hemoglobin: 12.3 g/dL — ABNORMAL LOW (ref 13.0–17.0)
MCH: 31.1 pg (ref 26.0–34.0)
MCHC: 31.9 g/dL (ref 30.0–36.0)
MCV: 97.2 fL (ref 80.0–100.0)
Platelets: 251 K/uL (ref 150–400)
RBC: 3.96 MIL/uL — ABNORMAL LOW (ref 4.22–5.81)
RDW: 13.6 % (ref 11.5–15.5)
WBC: 9.3 K/uL (ref 4.0–10.5)
nRBC: 0 % (ref 0.0–0.2)

## 2024-07-03 LAB — HEPATIC FUNCTION PANEL
ALT: 101 U/L — ABNORMAL HIGH (ref 0–44)
AST: 52 U/L — ABNORMAL HIGH (ref 15–41)
Albumin: 2.3 g/dL — ABNORMAL LOW (ref 3.5–5.0)
Alkaline Phosphatase: 104 U/L (ref 38–126)
Bilirubin, Direct: <0.1 mg/dL (ref 0.0–0.2)
Total Bilirubin: 0.5 mg/dL (ref 0.0–1.2)
Total Protein: 5.7 g/dL — ABNORMAL LOW (ref 6.5–8.1)

## 2024-07-03 LAB — BASIC METABOLIC PANEL WITH GFR
Anion gap: 10 (ref 5–15)
BUN: 21 mg/dL (ref 8–23)
CO2: 25 mmol/L (ref 22–32)
Calcium: 8.4 mg/dL — ABNORMAL LOW (ref 8.9–10.3)
Chloride: 107 mmol/L (ref 98–111)
Creatinine, Ser: 1.54 mg/dL — ABNORMAL HIGH (ref 0.61–1.24)
GFR, Estimated: 50 mL/min — ABNORMAL LOW (ref >60)
Glucose, Bld: 98 mg/dL (ref 70–99)
Potassium: 4.2 mmol/L (ref 3.5–5.1)
Sodium: 142 mmol/L (ref 135–145)

## 2024-07-03 LAB — MAGNESIUM: Magnesium: 2.2 mg/dL (ref 1.7–2.4)

## 2024-07-03 LAB — C-REACTIVE PROTEIN: CRP: 13.1 mg/dL — ABNORMAL HIGH (ref <1.0)

## 2024-07-03 MED ORDER — AMOXICILLIN-POT CLAVULANATE 875-125 MG PO TABS: 1.0000 | ORAL_TABLET | Freq: Two times a day (BID) | ORAL | 0 refills | Status: AC

## 2024-07-03 NOTE — Discharge Summary (Signed)
 Physician Discharge Summary   Patient: Raymond Cox MRN: [id number] DOB: 12-Sep-1961  Admit date:     06/29/2024  Discharge date: 07/03/24  Discharge Physician: Georgi Navarrete    PCP: [facility]     Discharge Diagnoses: Principal Problem:   Severe sepsis [facility]) Active Problems:   Acute renal failure superimposed on stage 3b chronic kidney disease (HCC)   Acute cystitis   High anion gap metabolic acidosis   Generalized weakness   Transaminitis   History of essential hypertension   History of seizures  Resolved Problems:   * No resolved hospital problems. *  Hospital Course: 63 yo M treated for severe sepsis due to UTI and e.coli bacteremia. He received IV ceftriaxone  2 g daily. WBC improved from 17.6 --> 9.3. IV ceftriaxone  was changed to IV cefazolin  based on the blood C&S. Repeat blood cx 07/02/2024 showed no growth to date. The pt's AKI on CKD 3b improved as the infection cleared. LFT's also improved as the infection cleared. PT/OT were helpful and did consult on the pt while he was here. Pt will go home with 6 more days of PO augmentin  875 mg PO bid (pt's Cr Clearance is 55.6 ml/min). This will complete 10 days total of antibx for his e.coli bacteremia.   Wife at the bedside will help the pt at home as needed.   DISCHARGE MEDICATION: Allergies as of 07/03/2024   No Known Allergies      Medication List     STOP taking these medications    aspirin 81 MG chewable tablet       TAKE these medications    acetaminophen  500 MG tablet Commonly known as: TYLENOL  Take 500 mg by mouth daily as needed for fever or headache (pain).   amoxicillin -clavulanate 875-125 MG tablet Commonly known as: AUGMENTIN  Take 1 tablet by mouth 2 (two) times daily for 6 days.   Capsaicin 0.1 % Crea Apply 1 application. topically 2 (two) times daily as needed (pain).   diltiazem  240 MG 24 hr capsule Commonly known as: DILACOR XR  Take 240 mg by mouth daily. When able to remember    divalproex  500 MG DR tablet Commonly known as: DEPAKOTE  Take 2 tablets (1,000 mg total) by mouth every 12 (twelve) hours. What changed:  when to take this additional instructions Another medication with the same name was removed. Continue taking this medication, and follow the directions you see here.   feeding supplement (GLUCERNA SHAKE) Liqd Take 1 Container by mouth 3 (three) times daily between meals.   hydrALAZINE  50 MG tablet Commonly known as: APRESOLINE  Take 50 mg by mouth in the morning and at bedtime. When able to remember   levETIRAcetam  1000 MG tablet Commonly known as: KEPPRA  Take 1 tablet (1,000 mg total) by mouth 2 (two) times daily. What changed:  when to take this additional instructions Another medication with the same name was removed. Continue taking this medication, and follow the directions you see here.   MUCINEX NIGHTSHIFT SINUS PO Take 10 mLs by mouth daily as needed (fever/cough).   oxcarbazepine  600 MG tablet Commonly known as: TRILEPTAL  Take 1 tablet (600 mg total) by mouth 2 (two) times daily. What changed:  when to take this additional instructions Another medication with the same name was removed. Continue taking this medication, and follow the directions you see here.   sildenafil 100 MG tablet Commonly known as: VIAGRA Take 50 mg by mouth See admin instructions. Take 50 mg by mouth 1 hour prior  to sexual activity. Do not exceed 1 dose per 24 hours   SUMAtriptan 50 MG tablet Commonly known as: IMITREX Take 50 mg by mouth See admin instructions. Take 50 mg by mouth at the onset of headache, then repeat in 2 hours if headache persists or recurs. No more than 200 mg a day        Follow-up Information     [facility] Follow up.   Why: [facility] will provide home health services.  They will call you in the next 24-48 hours to set up services. Contact information: [phone]               Discharge  Exam: Filed Weights   06/29/24 1734 06/29/24 2232 07/01/24 0500  Weight: 93 kg 87.4 kg 88.1 kg   Physical Exam HENT:     Head: Normocephalic.  Cardiovascular:     Rate and Rhythm: Normal rate.  Pulmonary:     Effort: Pulmonary effort is normal.  Abdominal:     General: Abdomen is flat.     Palpations: Abdomen is soft.  Skin:    General: Skin is warm.  Neurological:     Mental Status: He is alert and oriented to person, place, and time.  Psychiatric:        Mood and Affect: Mood normal.      Condition at discharge: fair  The results of significant diagnostics from this hospitalization (including imaging, microbiology, ancillary and laboratory) are listed below for reference.   Imaging Studies: CT ABDOMEN PELVIS WO CONTRAST Result Date: 06/29/2024 CLINICAL DATA:  Abdominal pain, acute, nonlocalized abd pain, sepsis. Fever. Fatigue, vomiting abdominal pain EXAM: CT ABDOMEN AND PELVIS WITHOUT CONTRAST TECHNIQUE: Multidetector CT imaging of the abdomen and pelvis was performed following the standard protocol without IV contrast. RADIATION DOSE REDUCTION: This exam was performed according to the departmental dose-optimization program which includes automated exposure control, adjustment of the mA and/or kV according to patient size and/or use of iterative reconstruction technique. COMPARISON:  None Available. FINDINGS: Lower chest: No acute abnormality. Hepatobiliary: No focal liver abnormality. No gallstones, gallbladder wall thickening, or pericholecystic fluid. No biliary dilatation. Pancreas: No focal lesion. Normal pancreatic contour. No surrounding inflammatory changes. No main pancreatic ductal dilatation. Spleen: Normal in size without focal abnormality. Adrenals/Urinary Tract: No adrenal nodule bilaterally. Nonspecific bilateral mild perinephric fat stranding. No nephrolithiasis and no hydronephrosis. No definite contour-deforming renal mass. No ureterolithiasis or hydroureter. The  urinary bladder is unremarkable. Stomach/Bowel: Stomach is within normal limits. No evidence of bowel wall thickening or dilatation. The appendix is not definitely identified with no inflammatory changes in the right lower quadrant to suggest acute appendicitis. Vascular/Lymphatic: No abdominal aorta or iliac aneurysm. Moderate atherosclerotic plaque of the aorta and its branches. No abdominal, pelvic, or inguinal lymphadenopathy. Reproductive: Prostate is unremarkable. Other: No intraperitoneal free fluid. No intraperitoneal free gas. No organized fluid collection. Musculoskeletal: No abdominal wall hernia or abnormality. No suspicious lytic or blastic osseous lesions. No acute displaced fracture. Multilevel degenerative changes of the spine. IMPRESSION: 1. No acute intra-abdominal or intrapelvic abnormality with limited evaluation on this noncontrast study. 2.  Aortic Atherosclerosis (ICD10-I70.0). Electronically Signed   By: Morgane  Naveau M.D.   On: 06/29/2024 19:51   DG Chest Port 1 View Result Date: 06/29/2024 CLINICAL DATA:  Fatigue, fever, abdominal pain, and vomiting. EXAM: PORTABLE CHEST 1 VIEW COMPARISON:  02/13/2022. FINDINGS: The heart size and mediastinal contours are within normal limits. There is atherosclerotic calcification of the  aorta. No consolidation, effusion, or pneumothorax is seen. No acute osseous abnormality. IMPRESSION: No active disease. Electronically Signed   By: Leita Birmingham M.D.   On: 06/29/2024 18:36    Microbiology: Results for orders placed or performed during the hospital encounter of 06/29/24  Resp panel by RT-PCR (RSV, Flu A&B, Covid) Anterior Nasal Swab     Status: None   Collection Time: 06/29/24  5:46 PM   Specimen: Anterior Nasal Swab  Result Value Ref Range Status   SARS Coronavirus 2 by RT PCR NEGATIVE NEGATIVE Final    Comment: (NOTE) SARS-CoV-2 target nucleic acids are NOT DETECTED.  The SARS-CoV-2 RNA is generally detectable in upper  respiratory specimens during the acute phase of infection. The lowest concentration of SARS-CoV-2 viral copies this assay can detect is 138 copies/mL. A negative result does not preclude SARS-Cov-2 infection and should not be used as the sole basis for treatment or other patient management decisions. A negative result may occur with  improper specimen collection/handling, submission of specimen other than nasopharyngeal swab, presence of viral mutation(s) within the areas targeted by this assay, and inadequate number of viral copies(<138 copies/mL). A negative result must be combined with clinical observations, patient history, and epidemiological information. The expected result is Negative.  Fact Sheet for Patients:  BloggerCourse.com  Fact Sheet for Healthcare Providers:  SeriousBroker.it  This test is no t yet approved or cleared by the [place]  FDA and  has been authorized for detection and/or diagnosis of SARS-CoV-2 by FDA under an Emergency Use Authorization (EUA). This EUA will remain  in effect (meaning this test can be used) for the duration of the COVID-19 declaration under Section 564(b)(1) of the Act, 21 U.S.C.section 360bbb-3(b)(1), unless the authorization is terminated  or revoked sooner.       Influenza A by PCR NEGATIVE NEGATIVE Final   Influenza B by PCR NEGATIVE NEGATIVE Final    Comment: (NOTE) The Xpert Xpress SARS-CoV-2/FLU/RSV plus assay is intended as an aid in the diagnosis of influenza from Nasopharyngeal swab specimens and should not be used as a sole basis for treatment. Nasal washings and aspirates are unacceptable for Xpert Xpress SARS-CoV-2/FLU/RSV testing.  Fact Sheet for Patients: BloggerCourse.com  Fact Sheet for Healthcare Providers: SeriousBroker.it  This test is not yet approved or cleared by the [place]  FDA and has been  authorized for detection and/or diagnosis of SARS-CoV-2 by FDA under an Emergency Use Authorization (EUA). This EUA will remain in effect (meaning this test can be used) for the duration of the COVID-19 declaration under Section 564(b)(1) of the Act, 21 U.S.C. section 360bbb-3(b)(1), unless the authorization is terminated or revoked.     Resp Syncytial Virus by PCR NEGATIVE NEGATIVE Final    Comment: (NOTE) Fact Sheet for Patients: BloggerCourse.com  Fact Sheet for Healthcare Providers: SeriousBroker.it  This test is not yet approved or cleared by the [place]  FDA and has been authorized for detection and/or diagnosis of SARS-CoV-2 by FDA under an Emergency Use Authorization (EUA). This EUA will remain in effect (meaning this test can be used) for the duration of the COVID-19 declaration under Section 564(b)(1) of the Act, 21 U.S.C. section 360bbb-3(b)(1), unless the authorization is terminated or revoked.  Performed at [facility], [address] [place], [place] [address]   Urine Culture     Status: None   Collection Time: 06/29/24  5:46 PM   Specimen: Urine, Random  Result Value Ref Range Status   Specimen Description  Final    URINE, RANDOM Performed at [facility], [address] [place], [place] [address]    Special Requests   Final    NONE Reflexed from [id number] Performed at [facility], [address], [place], [place] [address]    Culture   Final    NO GROWTH Performed at [facility], [address], [place], [place] [address]    Report Status 07/01/2024 FINAL  Final  Blood culture (routine x 2)     Status: None (Preliminary result)   Collection Time: 06/29/24  5:51 PM   Specimen: BLOOD  Result Value Ref Range Status   Specimen Description   Final    BLOOD LEFT ANTECUBITAL Performed at [facility], [address] [place], [place] [address]    Special Requests   Final    BOTTLES DRAWN AEROBIC AND ANAEROBIC Blood Culture adequate volume Performed at [facility], [address] [place], [place] [address]    Culture   Final    NO GROWTH 4 DAYS Performed at [facility], [address] [place], [place] [address]    Report Status PENDING  Incomplete  Blood culture (routine x 2)     Status: Abnormal   Collection Time: 06/29/24  5:52 PM   Specimen: BLOOD  Result Value Ref Range Status   Specimen Description   Final    BLOOD RIGHT ANTECUBITAL Performed at [facility], [address] [place], [place] [address]    Special Requests   Final    BOTTLES DRAWN AEROBIC AND ANAEROBIC Blood Culture results may not be optimal due to an inadequate volume of blood received in culture bottles Performed at [facility], [address] [place], [place] [address]    Culture  Setup Time   Final    GRAM NEGATIVE RODS ANAEROBIC BOTTLE ONLY CRITICAL RESULT CALLED TO, READ BACK BY AND VERIFIED WITH: [facility] JESSICA MILLEN [id number] AT 0815 BY EC Performed at [facility], [address] [place], [place] [address]    Culture ESCHERICHIA COLI (A)  Final   Report Status 07/02/2024 FINAL  Final   Organism ID, Bacteria ESCHERICHIA COLI  Final      Susceptibility   Escherichia coli - MIC*    AMPICILLIN <=2 SENSITIVE Sensitive     CEFAZOLIN  Value in next row Sensitive      <=1 SENSITIVEThis is a modified FDA-approved test that has been validated and its performance characteristics determined by the reporting laboratory.  This laboratory is certified under the Clinical Laboratory Improvement Amendments CLIA as qualified to perform high complexity clinical laboratory testing.    CEFEPIME  Value in next row Sensitive      <=1 SENSITIVEThis is a modified FDA-approved test that has been validated and its performance characteristics determined by the  reporting laboratory.  This laboratory is certified under the Clinical Laboratory Improvement Amendments CLIA as qualified to perform high complexity clinical laboratory testing.    ERTAPENEM Value in next row Sensitive      <=1 SENSITIVEThis is a modified FDA-approved test that has been validated and its performance characteristics determined by the reporting laboratory.  This laboratory is certified under the Clinical Laboratory Improvement Amendments CLIA as qualified to perform high complexity clinical laboratory testing.    CEFTRIAXONE  Value in next row Sensitive      <=1 SENSITIVEThis is a modified FDA-approved test that has been validated and its performance characteristics determined by the reporting  laboratory.  This laboratory is certified under the Clinical Laboratory Improvement Amendments CLIA as qualified to perform high complexity clinical laboratory testing.    CIPROFLOXACIN Value in next row Sensitive      <=1 SENSITIVEThis is a modified FDA-approved test that has been validated and its performance characteristics determined by the reporting laboratory.  This laboratory is certified under the Clinical Laboratory Improvement Amendments CLIA as qualified to perform high complexity clinical laboratory testing.    GENTAMICIN Value in next row Sensitive      <=1 SENSITIVEThis is a modified FDA-approved test that has been validated and its performance characteristics determined by the reporting laboratory.  This laboratory is certified under the Clinical Laboratory Improvement Amendments CLIA as qualified to perform high complexity clinical laboratory testing.    MEROPENEM Value in next row Sensitive      <=1 SENSITIVEThis is a modified FDA-approved test that has been validated and its performance characteristics determined by the reporting laboratory.  This laboratory is certified under the Clinical Laboratory Improvement Amendments CLIA as qualified to perform high complexity clinical  laboratory testing.    TRIMETH/SULFA Value in next row Sensitive      <=1 SENSITIVEThis is a modified FDA-approved test that has been validated and its performance characteristics determined by the reporting laboratory.  This laboratory is certified under the Clinical Laboratory Improvement Amendments CLIA as qualified to perform high complexity clinical laboratory testing.    AMPICILLIN/SULBACTAM Value in next row Sensitive      <=1 SENSITIVEThis is a modified FDA-approved test that has been validated and its performance characteristics determined by the reporting laboratory.  This laboratory is certified under the Clinical Laboratory Improvement Amendments CLIA as qualified to perform high complexity clinical laboratory testing.    PIP/TAZO Value in next row Sensitive ug/mL     <=4 SENSITIVEThis is a modified FDA-approved test that has been validated and its performance characteristics determined by the reporting laboratory.  This laboratory is certified under the Clinical Laboratory Improvement Amendments CLIA as qualified to perform high complexity clinical laboratory testing.    * ESCHERICHIA COLI  Blood Culture ID Panel (Reflexed)     Status: Abnormal   Collection Time: 06/29/24  5:52 PM  Result Value Ref Range Status   Enterococcus faecalis NOT DETECTED NOT DETECTED Final   Enterococcus Faecium NOT DETECTED NOT DETECTED Final   Listeria monocytogenes NOT DETECTED NOT DETECTED Final   Staphylococcus species NOT DETECTED NOT DETECTED Final   Staphylococcus aureus (BCID) NOT DETECTED NOT DETECTED Final   Staphylococcus epidermidis NOT DETECTED NOT DETECTED Final   Staphylococcus lugdunensis NOT DETECTED NOT DETECTED Final   Streptococcus species NOT DETECTED NOT DETECTED Final   Streptococcus agalactiae NOT DETECTED NOT DETECTED Final   Streptococcus pneumoniae NOT DETECTED NOT DETECTED Final   Streptococcus pyogenes NOT DETECTED NOT DETECTED Final   A.calcoaceticus-baumannii NOT DETECTED  NOT DETECTED Final   Bacteroides fragilis NOT DETECTED NOT DETECTED Final   Enterobacterales DETECTED (A) NOT DETECTED Final    Comment: Enterobacterales represent a large order of gram negative bacteria, not a single organism. CRITICAL RESULT CALLED TO, READ BACK BY AND VERIFIED WITH: [facility] JESSICA MILLEN [id number] AT 0815 BY EC    Enterobacter cloacae complex NOT DETECTED NOT DETECTED Final   Escherichia coli DETECTED (A) NOT DETECTED Final    Comment: CRITICAL RESULT CALLED TO, READ BACK BY AND VERIFIED WITH: [facility] JESSICA MILLEN [id number] AT 0815 BY EC    Klebsiella aerogenes NOT DETECTED NOT DETECTED Final  Klebsiella oxytoca NOT DETECTED NOT DETECTED Final   Klebsiella pneumoniae NOT DETECTED NOT DETECTED Final   Proteus species NOT DETECTED NOT DETECTED Final   Salmonella species NOT DETECTED NOT DETECTED Final   Serratia marcescens NOT DETECTED NOT DETECTED Final   Haemophilus influenzae NOT DETECTED NOT DETECTED Final   Neisseria meningitidis NOT DETECTED NOT DETECTED Final   Pseudomonas aeruginosa NOT DETECTED NOT DETECTED Final   Stenotrophomonas maltophilia NOT DETECTED NOT DETECTED Final   Candida albicans NOT DETECTED NOT DETECTED Final   Candida auris NOT DETECTED NOT DETECTED Final   Candida glabrata NOT DETECTED NOT DETECTED Final   Candida krusei NOT DETECTED NOT DETECTED Final   Candida parapsilosis NOT DETECTED NOT DETECTED Final   Candida tropicalis NOT DETECTED NOT DETECTED Final   Cryptococcus neoformans/gattii NOT DETECTED NOT DETECTED Final   CTX-M ESBL NOT DETECTED NOT DETECTED Final   Carbapenem resistance IMP NOT DETECTED NOT DETECTED Final   Carbapenem resistance KPC NOT DETECTED NOT DETECTED Final   Carbapenem resistance NDM NOT DETECTED NOT DETECTED Final   Carbapenem resist OXA 48 LIKE NOT DETECTED NOT DETECTED Final   Carbapenem resistance VIM NOT DETECTED NOT DETECTED Final    Comment: Performed at [facility], [address],  [place], [place] [address]  Culture, blood (Routine X 2) w Reflex to ID Panel     Status: None (Preliminary result)   Collection Time: 07/02/24  1:50 PM   Specimen: BLOOD LEFT HAND  Result Value Ref Range Status   Specimen Description BLOOD LEFT HAND  Final   Special Requests   Final    BOTTLES DRAWN AEROBIC ONLY Blood Culture results may not be optimal due to an inadequate volume of blood received in culture bottles   Culture   Final    NO GROWTH < 12 HOURS Performed at [facility], [address], [place], [place] [address]    Report Status PENDING  Incomplete  Culture, blood (Routine X 2) w Reflex to ID Panel     Status: None (Preliminary result)   Collection Time: 07/02/24  1:51 PM   Specimen: BLOOD  Result Value Ref Range Status   Specimen Description BLOOD RIGHT ANTECUBITAL  Final   Special Requests   Final    BOTTLES DRAWN AEROBIC ONLY Blood Culture adequate volume   Culture   Final    NO GROWTH < 12 HOURS Performed at [facility], [address], [place], [place] [address]    Report Status PENDING  Incomplete    Labs: CBC: Recent Labs  Lab 06/29/24 1746 06/30/24 0402 07/01/24 0459 07/02/24 0324 07/03/24 0210  WBC 17.6* 15.3* 13.8* 10.6* 9.3  NEUTROABS 12.8* 11.1*  --  6.9  --   HGB 15.6 12.8* 13.1 12.6* 12.3*  HCT 46.8 39.5 41.0 39.2 38.5*  MCV 95.1 96.8 96.9 98.0 97.2  PLT 161 155 194 221 251   Basic Metabolic Panel: Recent Labs  Lab 06/29/24 1746 06/30/24 0402 07/01/24 0459 07/02/24 0324 07/03/24 0210  NA 139 142 141 139 142  K 4.2 4.3 4.1 4.1 4.2  CL 103 110 108 107 107  CO2 19* 23 23 21* 25  GLUCOSE 123* 117* 107* 109* 98  BUN 31* 27* 27* 25* 21  CREATININE 2.13* 1.87* 1.69* 1.52* 1.54*  CALCIUM 9.3 8.1* 8.4* 8.4* 8.4*  MG  --  2.0 2.4  --  2.2  PHOS  --   --  4.1  --   --  Liver Function Tests: Recent Labs  Lab 06/29/24 1746 06/30/24 0402 07/01/24 0459 07/02/24 0324 07/03/24 0210  AST 127* 72* 87* 77* 52*  ALT 175* 124*  140* 139* 101*  ALKPHOS 176* 104 115 113 104  BILITOT 0.7 0.8 0.2 0.6 0.5  PROT 7.8 6.0* 6.1* 6.0* 5.7*  ALBUMIN 3.8 2.5* 2.4* 2.4* 2.3*   CBG: Recent Labs  Lab 07/01/24 2225  GLUCAP 125*    Discharge time spent: greater than 30 minutes.  Signed: Cynthea Zachman , MD [facility] 07/03/2024

## 2024-07-04 LAB — CULTURE, BLOOD (ROUTINE X 2)
Culture: NO GROWTH
Special Requests: ADEQUATE

## 2024-07-07 LAB — CULTURE, BLOOD (ROUTINE X 2)
Culture: NO GROWTH
Culture: NO GROWTH
Special Requests: ADEQUATE
